# Patient Record
Sex: Male | Born: 1955 | Race: White | Hispanic: No | Marital: Married | Smoking: Former smoker
Health system: Southern US, Community
[De-identification: ages and names within clinical notes are randomized; demographics above are authoritative.]

## PROBLEM LIST (undated history)

## (undated) DIAGNOSIS — E669 Obesity, unspecified: Secondary | ICD-10-CM

## (undated) DIAGNOSIS — E785 Hyperlipidemia, unspecified: Secondary | ICD-10-CM

## (undated) DIAGNOSIS — N529 Male erectile dysfunction, unspecified: Secondary | ICD-10-CM

## (undated) DIAGNOSIS — K439 Ventral hernia without obstruction or gangrene: Secondary | ICD-10-CM

## (undated) DIAGNOSIS — F439 Reaction to severe stress, unspecified: Secondary | ICD-10-CM

## (undated) DIAGNOSIS — F419 Anxiety disorder, unspecified: Secondary | ICD-10-CM

## (undated) DIAGNOSIS — F32A Depression, unspecified: Secondary | ICD-10-CM

## (undated) HISTORY — PX: FRACTURE SURGERY: SHX138

## (undated) HISTORY — DX: Obesity, unspecified: E66.9

## (undated) HISTORY — PX: HERNIA REPAIR: SHX51

## (undated) HISTORY — DX: Hyperlipidemia, unspecified: E78.5

## (undated) HISTORY — DX: Reaction to severe stress, unspecified: F43.9

## (undated) HISTORY — DX: Male erectile dysfunction, unspecified: N52.9

## (undated) HISTORY — DX: Depression, unspecified: F32.A

---

## 2013-07-28 ENCOUNTER — Telehealth: Payer: Self-pay

## 2013-07-28 ENCOUNTER — Other Ambulatory Visit: Payer: Self-pay

## 2013-07-28 DIAGNOSIS — Z1211 Encounter for screening for malignant neoplasm of colon: Secondary | ICD-10-CM

## 2013-07-29 NOTE — Telephone Encounter (Signed)
Gastroenterology Pre-Procedure Review  Request Date:07/28/2013 Requesting Physician: Dr. Juanetta GoslingHawkins  PATIENT REVIEW QUESTIONS: The patient responded to the following health history questions as indicated:    1. Diabetes Melitis: no 2. Joint replacements in the past 12 months: no 3. Major health problems in the past 3 months: no 4. Has an artificial valve or MVP: no 5. Has a defibrillator: no 6. Has been advised in past to take antibiotics in advance of a procedure like teeth cleaning: no    MEDICATIONS & ALLERGIES:    Patient reports the following regarding taking any blood thinners:   Plavix? no Aspirin? YES Coumadin? no  Patient confirms/reports the following medications:  Current Outpatient Prescriptions  Medication Sig Dispense Refill  . aspirin 81 MG tablet Take 81 mg by mouth daily.      . cholecalciferol (VITAMIN D) 1000 UNITS tablet Take 1,000 Units by mouth 2 (two) times daily.      Marland Kitchen. glucosamine-chondroitin 500-400 MG tablet Take 1 tablet by mouth daily.      . NON FORMULARY Calcium 600 mg   One tablet daily      . Omega-3 Fatty Acids (FISH OIL) 1000 MG CAPS Take by mouth. PT takes 2 tablets daily       No current facility-administered medications for this visit.    Patient confirms/reports the following allergies:  Not on File  No orders of the defined types were placed in this encounter.    AUTHORIZATION INFORMATION Primary Insurance:   ID #:   Group #:  Pre-Cert / Auth required:  Pre-Cert / Auth #:   Secondary Insurance:   ID #:   Group #:  Pre-Cert / Auth required:  Pre-Cert / Auth #:   SCHEDULE INFORMATION: Procedure has been scheduled as follows:  Date:   09/03/2013         Time:  7:30 AM Location: [facility] Short Stay  This Gastroenterology Pre-Precedure Review Form is being routed to the following provider(s): R. Roetta SessionsMichael Rourk, MD

## 2013-07-29 NOTE — Telephone Encounter (Signed)
Ok to schedule.

## 2013-07-30 MED ORDER — PEG-KCL-NACL-NASULF-NA ASC-C 100 G PO SOLR: 1.0000 | ORAL | Status: DC

## 2013-07-30 NOTE — Telephone Encounter (Signed)
Rx sent to the pharmacy and instructions mailed to pt.  

## 2013-08-20 ENCOUNTER — Encounter (HOSPITAL_COMMUNITY): Payer: Self-pay | Admitting: Pharmacy Technician

## 2013-09-03 ENCOUNTER — Encounter (HOSPITAL_COMMUNITY): Payer: Self-pay | Admitting: *Deleted

## 2013-09-03 ENCOUNTER — Encounter (HOSPITAL_COMMUNITY)
Admission: RE | Disposition: A | Discharge status: Home / Self Care | Payer: Self-pay | Source: Ambulatory Visit | Attending: Internal Medicine

## 2013-09-03 ENCOUNTER — Ambulatory Visit (HOSPITAL_COMMUNITY)
Admission: RE | Admit: 2013-09-03 | Discharge: 2013-09-03 | Disposition: A | Discharge status: Home / Self Care | Payer: Managed Care, Other (non HMO) | Source: Ambulatory Visit | Attending: Internal Medicine | Admitting: Internal Medicine

## 2013-09-03 DIAGNOSIS — Z1211 Encounter for screening for malignant neoplasm of colon: Secondary | ICD-10-CM

## 2013-09-03 DIAGNOSIS — K5731 Diverticulosis of large intestine without perforation or abscess with bleeding: Secondary | ICD-10-CM

## 2013-09-03 DIAGNOSIS — Z91199 Patient's noncompliance with other medical treatment and regimen due to unspecified reason: Secondary | ICD-10-CM | POA: Insufficient documentation

## 2013-09-03 DIAGNOSIS — Z9119 Patient's noncompliance with other medical treatment and regimen: Secondary | ICD-10-CM | POA: Insufficient documentation

## 2013-09-03 DIAGNOSIS — K5730 Diverticulosis of large intestine without perforation or abscess without bleeding: Secondary | ICD-10-CM | POA: Insufficient documentation

## 2013-09-03 DIAGNOSIS — Z7982 Long term (current) use of aspirin: Secondary | ICD-10-CM | POA: Insufficient documentation

## 2013-09-03 HISTORY — DX: Ventral hernia without obstruction or gangrene: K43.9

## 2013-09-03 HISTORY — PX: COLONOSCOPY: SHX5424

## 2013-09-03 LAB — HM COLONOSCOPY

## 2013-09-03 SURGERY — COLONOSCOPY
Anesthesia: Moderate Sedation

## 2013-09-03 MED ORDER — MIDAZOLAM HCL 5 MG/5ML IJ SOLN
INTRAMUSCULAR | Status: DC | PRN
  Administered 2013-09-03: 2 mg via INTRAVENOUS
  Administered 2013-09-03: 1 mg via INTRAVENOUS
  Administered 2013-09-03: 2 mg via INTRAVENOUS
  Administered 2013-09-03: 1 mg via INTRAVENOUS

## 2013-09-03 MED ORDER — ONDANSETRON HCL 4 MG/2ML IJ SOLN
INTRAMUSCULAR | Status: DC
Start: 2013-09-03 — End: 2013-09-03
  Filled 2013-09-03: qty 2

## 2013-09-03 MED ORDER — MIDAZOLAM HCL 5 MG/5ML IJ SOLN
INTRAMUSCULAR | Status: DC
Start: 2013-09-03 — End: 2013-09-03
  Filled 2013-09-03: qty 10

## 2013-09-03 MED ORDER — MEPERIDINE HCL 100 MG/ML IJ SOLN
INTRAMUSCULAR | Status: DC | PRN
  Administered 2013-09-03 (×2): 50 mg via INTRAVENOUS

## 2013-09-03 MED ORDER — STERILE WATER FOR IRRIGATION IR SOLN
Status: DC | PRN
  Administered 2013-09-03: 08:00:00

## 2013-09-03 MED ORDER — MEPERIDINE HCL 100 MG/ML IJ SOLN
INTRAMUSCULAR | Status: AC
  Filled 2013-09-03: qty 2

## 2013-09-03 MED ORDER — ONDANSETRON HCL 4 MG/2ML IJ SOLN
INTRAMUSCULAR | Status: DC | PRN
  Administered 2013-09-03: 4 mg via INTRAVENOUS

## 2013-09-03 MED ORDER — SODIUM CHLORIDE 0.9 % IV SOLN
INTRAVENOUS | Status: DC
  Administered 2013-09-03: 07:00:00 via INTRAVENOUS

## 2013-09-03 NOTE — H&P (Signed)
_0 @   Primary Care Physician:  Alonza Bogus, MD Primary Gastroenterologist:  Dr.   Pre-Procedure History & Physical: HPI:  Mark Conway is a 58 y.o. male is here for a screening colonoscopy.   Past Medical History  Diagnosis Date  . Hernia of abdominal wall     Past Surgical History  Procedure Laterality Date  . Fracture surgery Right     right arm,   58 years   old    Prior to Admission medications   Medication Sig Start Date End Date Taking? Authorizing Provider  aspirin 81 MG tablet Take 81 mg by mouth daily.   Yes Historical Provider, MD  calcium carbonate (OS-CAL) 600 MG TABS tablet Take 600 mg by mouth daily with breakfast.   Yes Historical Provider, MD  cholecalciferol (VITAMIN D) 1000 UNITS tablet Take 1,000 Units by mouth 2 (two) times daily.   Yes Historical Provider, MD  glucosamine-chondroitin 500-400 MG tablet Take 2 tablets by mouth daily.    Yes Historical Provider, MD  Omega-3 Fatty Acids (FISH OIL) 1000 MG CAPS Take 1,000 mg by mouth daily. PT takes 2 tablets daily   Yes Historical Provider, MD  peg 3350 powder (MOVIPREP) 100 G SOLR Take 1 kit (200 g total) by mouth as directed. 07/30/13  Yes Daneil Dolin, MD    Allergies as of 07/28/2013  . (Not on File)    History reviewed. No pertinent family history.  History   Social History  . Marital Status: Married    Spouse Name: N/A    Number of Children: N/A  . Years of Education: N/A   Occupational History  . Not on file.   Social History Main Topics  . Smoking status: Former Smoker    Quit date: 08/04/2013  . Smokeless tobacco: Not on file  . Alcohol Use: No     Comment: rarely  . Drug Use: No  . Sexual Activity: Yes   Other Topics Concern  . Not on file   Social History Narrative  . No narrative on file    Review of Systems: See HPI, otherwise negative ROS  Physical Exam: Ht 6' (1.829 m)  Wt 276 lb (125.193 kg)  BMI 37.42 kg/m2 General:   Alert,  Well-developed,  well-nourished, pleasant and cooperative in NAD  bearded Head:  Normocephalic and atraumatic. Eyes:  Sclera clear, no icterus.   Conjunctiva pink. Ears:  Normal auditory acuity. Nose:  No deformity, discharge,  or lesions. Mouth:  No deformity or lesions, dentition normal. Neck:  Supple; no masses or thyromegaly. Lungs:  Clear throughout to auscultation.   No wheezes, crackles, or rhonchi. No acute distress. Heart:  Regular rate and rhythm; no murmurs, clicks, rubs,  or gallops. Abdomen:  Obese. Soft, nontender and nondistended. Patient has a golf ball sized umbilical hernia. Easily reducible.  No masses, hepatosplenomegaly Normal bowel sounds, without guarding, and without rebound.   Msk:  Symmetrical without gross deformities. Normal posture. Pulses:  Normal pulses noted. Extremities:  Without clubbing or edema. Neurologic:  Alert and  oriented x4;  grossly normal neurologically. Skin:  Intact without significant lesions or rashes. Cervical Nodes:  No significant cervical adenopathy. Psych:  Alert and cooperative. Normal mood and affect.  Impression/Plan: Mark Conway is now here to undergo a screening colonoscopy.   First-ever average risk screening examination Risks, benefits, limitations, imponderables and alternatives regarding colonoscopy have been reviewed with the patient. Questions have been answered. All parties agreeable.  Notice:  This dictation was prepared with Dragon dictation along with smaller phrase technology. Any transcriptional errors that result from this process are unintentional and may not be corrected upon review.

## 2013-09-03 NOTE — Op Note (Signed)
[facility] [address] [place], [id number]   COLONOSCOPY PROCEDURE REPORT  PATIENT: Mark Conway, Mark Conway  MR#:         [id number] BIRTHDATE: 1955-07-12 , 58  yrs. old GENDER: Male ENDOSCOPIST: R.  Roetta SessionsMichael Lysbeth Dicola, MD FACP FACG REFERRED BY:  Kari BaarsEdward Hawkins, M.D. PROCEDURE DATE:  09/03/2013 PROCEDURE:     Screening colonoscopy  INDICATIONS: First-ever average risk colorectal cancer screening examination; patient noncompliant with preparation; concerned solid food yesterday.  INFORMED CONSENT:  The risks, benefits, alternatives and imponderables including but not limited to bleeding, perforation as well as the possibility of a missed lesion have been reviewed.  The potential for biopsy, lesion removal, etc. have also been discussed.  Questions have been answered.  All parties agreeable. Please see the history and physical in the medical record for more information.  MEDICATIONS: Versed 6 mg IV and Demerol 100 mg in divided doses. Zofran 4 mg IV.  DESCRIPTION OF PROCEDURE:  After a digital rectal exam was performed, the EC-3890Li ([id number]  colonoscope was advanced from the anus through the rectum and colon to the area of the cecum, ileocecal valve and appendiceal orifice.  The cecum was deeply intubated.  These structures were well-seen and photographed for the record.  From the level of the cecum and ileocecal valve, the scope was slowly and cautiously withdrawn.  The mucosal surfaces were carefully surveyed utilizing scope tip deflection to facilitate fold flattening as needed.  The scope was pulled down into the rectum where a thorough examination including retroflexion was performed.    FINDINGS:  Preparation became eventually adequate; initially inadequate. Washing and suctioning removed the colonic effluent for eventually to gain adequate visualization. Somewhat of a redundant colon. Scattered left-sided transverse diverticula; the remainder of the colonic  mucosa appeared normal.  THERAPEUTIC / DIAGNOSTIC MANEUVERS PERFORMED:  None  COMPLICATIONS: none  CECAL WITHDRAWAL TIME:  12 minutes  IMPRESSION:  Colonic diverticulosis  RECOMMENDATIONS: Repeat colonoscopy in 10 years for screening purposes   _______________________________ eSigned:  R. Roetta SessionsMichael Emannuel Vise, MD FACP [facility] 09/03/2013 8:26 AM   CC:    PATIENT NAME:  Mark Conway, Mark Conway MR#: [id number]

## 2013-09-03 NOTE — Discharge Instructions (Signed)
°  Colonoscopy Discharge Instructions  Read the instructions outlined below and refer to this sheet in the next few weeks. These discharge instructions provide you with general information on caring for yourself after you leave the hospital. Your doctor may also give you specific instructions. While your treatment has been planned according to the most current medical practices available, unavoidable complications occasionally occur. If you have any problems or questions after discharge, call Dr. Jena Gaussourk at [phone]. ACTIVITY  You may resume your regular activity, but move at a slower pace for the next 24 hours.   Take frequent rest periods for the next 24 hours.   Walking will help get rid of the air and reduce the bloated feeling in your belly (abdomen).   No driving for 24 hours (because of the medicine (anesthesia) used during the test).    Do not sign any important legal documents or operate any machinery for 24 hours (because of the anesthesia used during the test).  NUTRITION  Drink plenty of fluids.   You may resume your normal diet as instructed by your doctor.   Begin with a light meal and progress to your normal diet. Heavy or fried foods are harder to digest and may make you feel sick to your stomach (nauseated).   Avoid alcoholic beverages for 24 hours or as instructed.  MEDICATIONS  You may resume your normal medications unless your doctor tells you otherwise.  WHAT YOU CAN EXPECT TODAY  Some feelings of bloating in the abdomen.   Passage of more gas than usual.   Spotting of blood in your stool or on the toilet paper.  IF YOU HAD POLYPS REMOVED DURING THE COLONOSCOPY:  No aspirin products for 7 days or as instructed.   No alcohol for 7 days or as instructed.   Eat a soft diet for the next 24 hours.  FINDING OUT THE RESULTS OF YOUR TEST Not all test results are available during your visit. If your test results are not back during the visit, make an appointment  with your caregiver to find out the results. Do not assume everything is normal if you have not heard from your caregiver or the medical facility. It is important for you to follow up on all of your test results.  SEEK IMMEDIATE MEDICAL ATTENTION IF:  You have more than a spotting of blood in your stool.   Your belly is swollen (abdominal distention).   You are nauseated or vomiting.   You have a temperature over 101.   You have abdominal pain or discomfort that is severe or gets worse throughout the day.   Diverticulosis information provided  Recommend repeat colonoscopy in 10 years for screening purposes

## 2013-09-06 ENCOUNTER — Encounter (HOSPITAL_COMMUNITY): Payer: Self-pay | Admitting: Internal Medicine

## 2013-10-19 ENCOUNTER — Other Ambulatory Visit (HOSPITAL_COMMUNITY): Payer: Self-pay | Admitting: Pulmonary Disease

## 2013-10-19 DIAGNOSIS — R109 Unspecified abdominal pain: Secondary | ICD-10-CM

## 2013-10-25 ENCOUNTER — Ambulatory Visit (HOSPITAL_COMMUNITY)
Admission: RE | Admit: 2013-10-25 | Discharge: 2013-10-25 | Disposition: A | Discharge status: Home / Self Care | Payer: Managed Care, Other (non HMO) | Source: Ambulatory Visit | Attending: Pulmonary Disease | Admitting: Pulmonary Disease

## 2013-10-25 ENCOUNTER — Ambulatory Visit (HOSPITAL_COMMUNITY): Payer: Managed Care, Other (non HMO)

## 2013-10-25 DIAGNOSIS — R109 Unspecified abdominal pain: Secondary | ICD-10-CM | POA: Insufficient documentation

## 2016-02-15 LAB — LIPID PANEL
Cholesterol: 192 (ref 0–200)
HDL: 30 — AB (ref 35–70)
LDL Cholesterol: 133
Triglycerides: 147 (ref 40–160)

## 2016-02-15 LAB — CBC AND DIFFERENTIAL: WBC: 5

## 2016-02-15 LAB — PSA: PSA: 0.6

## 2016-02-15 LAB — TSH: TSH: 1.67 (ref <=5.90)

## 2018-02-04 ENCOUNTER — Other Ambulatory Visit (HOSPITAL_COMMUNITY): Payer: Self-pay | Admitting: Pulmonary Disease

## 2018-02-04 ENCOUNTER — Ambulatory Visit (HOSPITAL_COMMUNITY)
Admission: RE | Admit: 2018-02-04 | Discharge: 2018-02-04 | Disposition: A | Discharge status: Home / Self Care | Payer: BC Managed Care – PPO | Source: Ambulatory Visit | Attending: Pulmonary Disease | Admitting: Pulmonary Disease

## 2018-02-04 DIAGNOSIS — M25511 Pain in right shoulder: Secondary | ICD-10-CM | POA: Insufficient documentation

## 2018-02-28 ENCOUNTER — Other Ambulatory Visit: Payer: Self-pay

## 2018-02-28 ENCOUNTER — Emergency Department (HOSPITAL_COMMUNITY): Payer: BC Managed Care – PPO

## 2018-02-28 ENCOUNTER — Observation Stay (HOSPITAL_COMMUNITY)
Admission: EM | Admit: 2018-02-28 | Discharge: 2018-03-01 | Disposition: A | Discharge status: Home / Self Care | Payer: BC Managed Care – PPO | Attending: General Surgery | Admitting: General Surgery

## 2018-02-28 ENCOUNTER — Encounter (HOSPITAL_COMMUNITY): Payer: Self-pay | Admitting: Emergency Medicine

## 2018-02-28 DIAGNOSIS — Z87891 Personal history of nicotine dependence: Secondary | ICD-10-CM | POA: Insufficient documentation

## 2018-02-28 DIAGNOSIS — K439 Ventral hernia without obstruction or gangrene: Secondary | ICD-10-CM

## 2018-02-28 DIAGNOSIS — Z79899 Other long term (current) drug therapy: Secondary | ICD-10-CM | POA: Insufficient documentation

## 2018-02-28 DIAGNOSIS — F419 Anxiety disorder, unspecified: Secondary | ICD-10-CM | POA: Diagnosis not present

## 2018-02-28 DIAGNOSIS — K429 Umbilical hernia without obstruction or gangrene: Secondary | ICD-10-CM

## 2018-02-28 DIAGNOSIS — K420 Umbilical hernia with obstruction, without gangrene: Secondary | ICD-10-CM | POA: Diagnosis not present

## 2018-02-28 DIAGNOSIS — Z01818 Encounter for other preprocedural examination: Secondary | ICD-10-CM

## 2018-02-28 DIAGNOSIS — Z7982 Long term (current) use of aspirin: Secondary | ICD-10-CM | POA: Insufficient documentation

## 2018-02-28 HISTORY — DX: Anxiety disorder, unspecified: F41.9

## 2018-02-28 LAB — URINALYSIS, ROUTINE W REFLEX MICROSCOPIC
Bilirubin Urine: NEGATIVE
Glucose, UA: NEGATIVE mg/dL
HGB URINE DIPSTICK: NEGATIVE
Ketones, ur: 5 mg/dL — AB
Leukocytes, UA: NEGATIVE
Nitrite: NEGATIVE
PROTEIN: 30 mg/dL — AB
Specific Gravity, Urine: 1.031 — ABNORMAL HIGH (ref 1.005–1.030)
pH: 5 (ref 5.0–8.0)

## 2018-02-28 LAB — CBC
HEMATOCRIT: 50.1 % (ref 39.0–52.0)
HEMOGLOBIN: 16.2 g/dL (ref 13.0–17.0)
MCH: 26.9 pg (ref 26.0–34.0)
MCHC: 32.3 g/dL (ref 30.0–36.0)
MCV: 83.2 fL (ref 80.0–100.0)
Platelets: 263 10*3/uL (ref 150–400)
RBC: 6.02 MIL/uL — ABNORMAL HIGH (ref 4.22–5.81)
RDW: 13.5 % (ref 11.5–15.5)
WBC: 12.8 10*3/uL — ABNORMAL HIGH (ref 4.0–10.5)
nRBC: 0 % (ref 0.0–0.2)

## 2018-02-28 LAB — COMPREHENSIVE METABOLIC PANEL
ALBUMIN: 4.9 g/dL (ref 3.5–5.0)
ALT: 17 U/L (ref 0–44)
AST: 20 U/L (ref 15–41)
Alkaline Phosphatase: 48 U/L (ref 38–126)
Anion gap: 12 (ref 5–15)
BUN: 35 mg/dL — AB (ref 6–20)
CHLORIDE: 100 mmol/L (ref 98–111)
CO2: 27 mmol/L (ref 22–32)
Calcium: 10.3 mg/dL (ref 8.9–10.3)
Creatinine, Ser: 1.25 mg/dL — ABNORMAL HIGH (ref 0.61–1.24)
GFR calc Af Amer: >60 mL/min (ref >60)
GFR calc non Af Amer: >60 mL/min (ref >60)
GLUCOSE: 116 mg/dL — AB (ref 70–99)
Potassium: 3.7 mmol/L (ref 3.5–5.1)
Sodium: 139 mmol/L (ref 135–145)
Total Bilirubin: 1.8 mg/dL — ABNORMAL HIGH (ref 0.3–1.2)
Total Protein: 8 g/dL (ref 6.5–8.1)

## 2018-02-28 LAB — LIPASE, BLOOD: LIPASE: 32 U/L (ref 11–51)

## 2018-02-28 MED ORDER — IOPAMIDOL (ISOVUE-300) INJECTION 61%
100.0000 mL | Freq: Once | INTRAVENOUS | Status: AC | PRN
  Administered 2018-02-28: 100 mL via INTRAVENOUS

## 2018-02-28 MED ORDER — MORPHINE SULFATE (PF) 4 MG/ML IV SOLN
4.0000 mg | Freq: Once | INTRAVENOUS | Status: AC
  Administered 2018-02-28: 4 mg via INTRAVENOUS
  Filled 2018-02-28: qty 1

## 2018-02-28 MED ORDER — SERTRALINE HCL 50 MG PO TABS
50.0000 mg | ORAL_TABLET | Freq: Every day | ORAL | Status: DC
  Administered 2018-02-28: 50 mg via ORAL
  Filled 2018-02-28 (×2): qty 1

## 2018-02-28 MED ORDER — DOCUSATE SODIUM 100 MG PO CAPS
100.0000 mg | ORAL_CAPSULE | Freq: Two times a day (BID) | ORAL | Status: DC
  Administered 2018-02-28: 100 mg via ORAL
  Filled 2018-02-28 (×2): qty 1

## 2018-02-28 MED ORDER — ENOXAPARIN SODIUM 40 MG/0.4ML ~~LOC~~ SOLN
40.0000 mg | SUBCUTANEOUS | Status: DC
  Administered 2018-02-28: 40 mg via SUBCUTANEOUS
  Filled 2018-02-28: qty 0.4

## 2018-02-28 MED ORDER — ENSURE ENLIVE PO LIQD: 237.0000 mL | Freq: Two times a day (BID) | ORAL | Status: DC

## 2018-02-28 MED ORDER — ONDANSETRON HCL 4 MG/2ML IJ SOLN: 4.0000 mg | Freq: Four times a day (QID) | INTRAMUSCULAR | Status: DC | PRN

## 2018-02-28 MED ORDER — METOPROLOL TARTRATE 5 MG/5ML IV SOLN: 5.0000 mg | Freq: Four times a day (QID) | INTRAVENOUS | Status: DC | PRN

## 2018-02-28 MED ORDER — IBUPROFEN 600 MG PO TABS: 600.0000 mg | ORAL_TABLET | Freq: Four times a day (QID) | ORAL | Status: DC | PRN

## 2018-02-28 MED ORDER — LACTATED RINGERS IV SOLN
INTRAVENOUS | Status: DC
  Administered 2018-02-28: 19:00:00 via INTRAVENOUS

## 2018-02-28 MED ORDER — MORPHINE SULFATE (PF) 2 MG/ML IV SOLN: 2.0000 mg | INTRAVENOUS | Status: DC | PRN

## 2018-02-28 MED ORDER — HYDROCODONE-ACETAMINOPHEN 5-325 MG PO TABS: 1.0000 | ORAL_TABLET | ORAL | Status: DC | PRN

## 2018-02-28 MED ORDER — ZOLPIDEM TARTRATE 5 MG PO TABS: 5.0000 mg | ORAL_TABLET | Freq: Every evening | ORAL | Status: DC | PRN

## 2018-02-28 MED ORDER — DIPHENHYDRAMINE HCL 12.5 MG/5ML PO ELIX: 12.5000 mg | ORAL_SOLUTION | Freq: Four times a day (QID) | ORAL | Status: DC | PRN

## 2018-02-28 MED ORDER — SIMETHICONE 80 MG PO CHEW: 40.0000 mg | CHEWABLE_TABLET | Freq: Four times a day (QID) | ORAL | Status: DC | PRN

## 2018-02-28 MED ORDER — DIPHENHYDRAMINE HCL 50 MG/ML IJ SOLN: 12.5000 mg | Freq: Four times a day (QID) | INTRAMUSCULAR | Status: DC | PRN

## 2018-02-28 MED ORDER — CEFAZOLIN SODIUM-DEXTROSE 2-4 GM/100ML-% IV SOLN
2.0000 g | INTRAVENOUS | Status: AC
  Administered 2018-03-01: 2 g via INTRAVENOUS
  Filled 2018-02-28 (×2): qty 100

## 2018-02-28 MED ORDER — MUPIROCIN 2 % EX OINT: 1.0000 "application " | TOPICAL_OINTMENT | Freq: Two times a day (BID) | CUTANEOUS | Status: DC

## 2018-02-28 MED ORDER — ONDANSETRON 4 MG PO TBDP: 4.0000 mg | ORAL_TABLET | Freq: Four times a day (QID) | ORAL | Status: DC | PRN

## 2018-02-28 NOTE — ED Triage Notes (Signed)
Pt c/o abdominal pain from umbilical hernia x 3-4 weeks. Pt has an appt with Dr. Lovell Sheehan 11/21. Endorses n/v and constipation.

## 2018-02-28 NOTE — ED Provider Notes (Signed)
[facility] EMERGENCY DEPARTMENT Provider Note   CSN: [id number] Arrival date & time: 02/28/18  1218     History   Chief Complaint Chief Complaint  Patient presents with  . Hernia    HPI Mark Conway is a 62 y.o. male.  Patient has had a periumbilical hernia for approximately 10 years.  He has had a surgical opinion, but no surgery at this point.  The hernia has bothered him more frequently in the past week, worse the past 2-3 days.  He is able to eat without vomiting.  No fever, sweats, chills.  Severity of pain is moderate.  Palpation makes pain worse.  He has been unable to reduce his hernia.     Past Medical History:  Diagnosis Date  . Anxiety   . Hernia of abdominal wall     Patient Active Problem List   Diagnosis Date Noted  . Periumbilical hernia 02/28/2018  . Hernia of abdominal wall   . Anxiety   . Incarcerated umbilical hernia     Past Surgical History:  Procedure Laterality Date  . COLONOSCOPY N/A 09/03/2013   Procedure: COLONOSCOPY;  Surgeon: Corbin Ade, MD;  Location: [facility];  Service: Endoscopy;  Laterality: N/A;  7:30 AM  . FRACTURE SURGERY Right    right arm,   62 years   old        Home Medications    Prior to Admission medications   Medication Sig Start Date End Date Taking? Authorizing Provider  aspirin 81 MG tablet Take 81 mg by mouth daily.   Yes [provider]  calcium carbonate (OS-CAL) 600 MG TABS tablet Take 600 mg by mouth daily with breakfast.   Yes [provider]  cholecalciferol (VITAMIN D) 1000 UNITS tablet Take 1,000 Units by mouth 2 (two) times daily.   Yes [provider]  glucosamine-chondroitin 500-400 MG tablet Take 2 tablets by mouth daily.    Yes [provider]  Omega-3 Fatty Acids (FISH OIL) 1000 MG CAPS Take 2 capsules by mouth daily. PT takes 2 tablets daily   Yes [provider]  sertraline (ZOLOFT) 50 MG tablet Take 50 mg by mouth daily.   Yes [provider]  TURMERIC PO Take 1 tablet by mouth daily.   Yes [provider]    Family History Family History  Problem Relation Age of Onset  . Dementia Mother   . Heart attack Father     Social History Social History   Tobacco Use  . Smoking status: Former Smoker    Last attempt to quit: 08/04/2013    Years since quitting: 4.5  . Smokeless tobacco: Never Used  Substance Use Topics  . Alcohol use: No    Comment: rarely  . Drug use: No     Allergies   Patient has no known allergies.   Review of Systems Review of Systems  All other systems reviewed and are negative.    Physical Exam Updated Vital Signs BP 134/87   Pulse 82   Temp 97.8 F (36.6 C) (Oral)   Resp 17   Ht 6\' 1"  (1.854 m)   Wt 117.9 kg   SpO2 97%   BMI 34.30 kg/m   Physical Exam  Constitutional: He is oriented to person, place, and time. He appears well-developed and well-nourished.  HENT:  Head: Normocephalic and atraumatic.  Eyes: Conjunctivae are normal.  Neck: Neck supple.  Cardiovascular: Normal rate and regular rhythm.  Pulmonary/Chest: Effort normal and  breath sounds normal.  Abdominal:  Obvious periumbilical hernia approximately 4 cm in diameter.  Soft tissues erythematous and tender to palpation.  Unable to successfully reduce hernia  Musculoskeletal: Normal range of motion.  Neurological: He is alert and oriented to person, place, and time.  Skin: Skin is warm and dry.  Psychiatric: He has a normal mood and affect. His behavior is normal.  Nursing note and vitals reviewed.    ED Treatments / Results  Labs (all labs ordered are listed, but only abnormal results are displayed) Labs Reviewed  COMPREHENSIVE METABOLIC PANEL - Abnormal; Notable for the following components:      Result Value   Glucose, Bld 116 (*)    BUN 35 (*)    Creatinine, Ser 1.25 (*)    Total Bilirubin 1.8 (*)    All other components within normal limits  CBC - Abnormal; Notable for the  following components:   WBC 12.8 (*)    RBC 6.02 (*)    All other components within normal limits  URINALYSIS, ROUTINE W REFLEX MICROSCOPIC - Abnormal; Notable for the following components:   Color, Urine AMBER (*)    APPearance CLOUDY (*)    Specific Gravity, Urine 1.031 (*)    Ketones, ur 5 (*)    Protein, ur 30 (*)    Bacteria, UA RARE (*)    All other components within normal limits  LIPASE, BLOOD    EKG None  Radiology Ct Abdomen Pelvis W Contrast  Result Date: 02/28/2018 CLINICAL DATA:  Abdominal pain.  Ventral hernia. EXAM: CT ABDOMEN AND PELVIS WITH CONTRAST TECHNIQUE: Multidetector CT imaging of the abdomen and pelvis was performed using the standard protocol following bolus administration of intravenous contrast. CONTRAST:  ISOVUE-300 IOPAMIDOL (ISOVUE-300) INJECTION 61% COMPARISON:  None. FINDINGS: Lower chest: 6 mm subpleural nodule is seen in right middle lobe. Visualized lung bases are otherwise unremarkable. Hepatobiliary: Small gallstone is noted without inflammation. No biliary dilatation is noted. Liver is unremarkable. Pancreas: Unremarkable. No pancreatic ductal dilatation or surrounding inflammatory changes. Spleen: Normal in size without focal abnormality. Adrenals/Urinary Tract: Adrenal glands are unremarkable. Kidneys are normal, without renal calculi, focal lesion, or hydronephrosis. Bladder is unremarkable. Stomach/Bowel: Large periumbilical hernia is noted which contains a portion of small bowel, which appears to be incarcerated and resulting in proximal small bowel obstruction. Stool is noted in the colon. No colonic dilatation is noted. The appendix appears normal. The stomach is unremarkable. Diverticulosis of descending and sigmoid colon is noted without inflammation. Vascular/Lymphatic: Aortic atherosclerosis. No enlarged abdominal or pelvic lymph nodes. Reproductive: Prostate is unremarkable. Other: No abnormal fluid collection is noted. Musculoskeletal: No  acute or significant osseous findings. IMPRESSION: Large periumbilical hernia is noted which contains a portion of incarcerated small bowel, which results in proximal small bowel obstruction. Diverticulosis of descending and sigmoid colon is noted without inflammation. Aortic Atherosclerosis (ICD10-I70.0). Electronically Signed   By: Lupita Raider, M.D.   On: 02/28/2018 17:44    Procedures Procedures (including critical care time)  Medications Ordered in ED Medications  lactated ringers infusion ( Intravenous New Bag/Given 02/28/18 1830)  iopamidol (ISOVUE-300) 61 % injection 100 mL (100 mLs Intravenous Contrast Given 02/28/18 1703)  morphine 4 MG/ML injection 4 mg (4 mg Intravenous Given 02/28/18 1744)     Initial Impression / Assessment and Plan / ED Course  I have reviewed the triage vital signs and the nursing notes.  Pertinent labs & imaging results that were available during my care of the  patient were reviewed by me and considered in my medical decision making (see chart for details).     Patient presents with worsening pain with a known periumbilical hernia.  CT abdomen pelvis reveals a portion of incarcerated small bowel.  Will consult general surgery for possible surgical intervention.  CRITICAL CARE Performed by: Donnetta Hutching Total critical care time: 30 minutes Critical care time was exclusive of separately billable procedures and treating other patients. Critical care was necessary to treat or prevent imminent or life-threatening deterioration. Critical care was time spent personally by me on the following activities: development of treatment plan with patient and/or surrogate as well as nursing, discussions with consultants, evaluation of patient's response to treatment, examination of patient, obtaining history from patient or surrogate, ordering and performing treatments and interventions, ordering and review of laboratory studies, ordering and review of radiographic studies,  pulse oximetry and re-evaluation of patient's condition.  Final Clinical Impressions(s) / ED Diagnoses   Final diagnoses:  Periumbilical hernia    ED Discharge Orders    None       Donnetta Hutching, MD 02/28/18 1927

## 2018-02-28 NOTE — H&P (Signed)
[facility] History and Physical  Reason for Referral: Umbilical hernia  Referring Physician:  Dr. Lacinda Axon   Chief Complaint    Hernia      Mark Conway is a 62 y.o. male.  HPI: Mark Conway is a 62 yo male with known umbilical hernia that has been present for years and has been getting worse over the last few weeks. He says that he has been getting sick with nausea/vomiting about once a month and that this occurred on Wednesday and since then he has not felt well.  He says that he vomited a few times. He has an appointment with Dr. Arnoldo Morale to discuss repair 11/21. His last BM was Wednesday but his normal is about every 2-3 days. He denies any fevers or chills.  He has not had an appetite either in the last few days with this nausea/vomiting.   He is otherwise relatively healthy per his report and takes minimal medications.  Past Medical History:  Diagnosis Date  . Anxiety   . Hernia of abdominal wall     Past Surgical History:  Procedure Laterality Date  . COLONOSCOPY N/A 09/03/2013   Procedure: COLONOSCOPY;  Surgeon: Daneil Dolin, MD;  Location: [facility];  Service: Endoscopy;  Laterality: N/A;  7:30 AM  . FRACTURE SURGERY Right    right arm,   62 years   old    Family History  Problem Relation Age of Onset  . Dementia Mother   . Heart attack Father     Social History   Tobacco Use  . Smoking status: Former Smoker    Last attempt to quit: 08/04/2013    Years since quitting: 4.5  . Smokeless tobacco: Never Used  Substance Use Topics  . Alcohol use: No    Comment: rarely  . Drug use: No    Medications: I have reviewed the patient's current medications. No current facility-administered medications for this encounter.    Current Outpatient Medications  Medication Sig Dispense Refill Last Dose  . aspirin 81 MG tablet Take 81 mg by mouth daily.   02/27/2018 at Unknown time  . calcium carbonate (OS-CAL) 600 MG TABS tablet Take 600 mg by mouth daily with  breakfast.   02/27/2018 at Unknown time  . cholecalciferol (VITAMIN D) 1000 UNITS tablet Take 1,000 Units by mouth 2 (two) times daily.   02/27/2018 at Unknown time  . glucosamine-chondroitin 500-400 MG tablet Take 2 tablets by mouth daily.    02/27/2018 at Unknown time  . Omega-3 Fatty Acids (FISH OIL) 1000 MG CAPS Take 2 capsules by mouth daily. PT takes 2 tablets daily   02/27/2018 at Unknown time  . sertraline (ZOLOFT) 50 MG tablet Take 50 mg by mouth daily.   02/27/2018 at Unknown time  . TURMERIC PO Take 1 tablet by mouth daily.   02/27/2018 at Unknown time    Allergies: No Known Allergies   ROS:  A comprehensive review of systems was negative except for: Gastrointestinal: positive for abdominal pain  Blood pressure 125/88, pulse 79, temperature 97.8 F (36.6 C), temperature source Oral, resp. rate 18, height 6' 1" (1.854 m), weight 117.9 kg, SpO2 97 %. Physical Exam  Constitutional: He is oriented to person, place, and time. He appears well-developed and well-nourished.  HENT:  Head: Normocephalic and atraumatic.  Eyes: Pupils are equal, round, and reactive to light. EOM are normal.  Neck: Normal range of motion.  Cardiovascular: Normal rate and regular rhythm.  Pulmonary/Chest: Effort normal.  Abdominal: Soft. He exhibits no distension. There is tenderness. No hernia.  Reduced hernia after morphine given, completely reduced, some redness of the skin after reduction from friction  Musculoskeletal: Normal range of motion. He exhibits no edema.  Neurological: He is alert and oriented to person, place, and time.  Skin: Skin is warm and dry.  Psychiatric: He has a normal mood and affect. His behavior is normal. Judgment and thought content normal.  Vitals reviewed.   Results: Results for orders placed or performed during the hospital encounter of 02/28/18 (from the past 48 hour(s))  Urinalysis, Routine w reflex microscopic     Status: Abnormal   Collection Time: 02/28/18 12:24 PM   Result Value Ref Range   Color, Urine AMBER (A) YELLOW    Comment: BIOCHEMICALS MAY BE AFFECTED BY COLOR   APPearance CLOUDY (A) CLEAR   Specific Gravity, Urine 1.031 (H) 1.005 - 1.030   pH 5.0 5.0 - 8.0   Glucose, UA NEGATIVE NEGATIVE mg/dL   Hgb urine dipstick NEGATIVE NEGATIVE   Bilirubin Urine NEGATIVE NEGATIVE   Ketones, ur 5 (A) NEGATIVE mg/dL   Protein, ur 30 (A) NEGATIVE mg/dL   Nitrite NEGATIVE NEGATIVE   Leukocytes, UA NEGATIVE NEGATIVE   RBC / HPF 0-5 0 - 5 RBC/hpf   WBC, UA 0-5 0 - 5 WBC/hpf   Bacteria, UA RARE (A) NONE SEEN   Squamous Epithelial / LPF 0-5 0 - 5   Mucus PRESENT     Comment: Performed at [facility], [address] [place], [place] [address]  Lipase, blood     Status: None   Collection Time: 02/28/18  2:02 PM  Result Value Ref Range   Lipase 32 11 - 51 U/L    Comment: Performed at [facility], [address] [place], [place] [address]  Comprehensive metabolic panel     Status: Abnormal   Collection Time: 02/28/18  2:02 PM  Result Value Ref Range   Sodium 139 135 - 145 mmol/L   Potassium 3.7 3.5 - 5.1 mmol/L   Chloride 100 98 - 111 mmol/L   CO2 27 22 - 32 mmol/L   Glucose, Bld 116 (H) 70 - 99 mg/dL   BUN 35 (H) 6 - 20 mg/dL   Creatinine, Ser 1.25 (H) 0.61 - 1.24 mg/dL   Calcium 10.3 8.9 - 10.3 mg/dL   Total Protein 8.0 6.5 - 8.1 g/dL   Albumin 4.9 3.5 - 5.0 g/dL   AST 20 15 - 41 U/L   ALT 17 0 - 44 U/L   Alkaline Phosphatase 48 38 - 126 U/L   Total Bilirubin 1.8 (H) 0.3 - 1.2 mg/dL   GFR calc non Af Amer >60 >60 mL/min   GFR calc Af Amer >60 >60 mL/min    Comment: (NOTE) The eGFR has been calculated using the CKD EPI equation. This calculation has not been validated in all clinical situations. eGFR's persistently <60 mL/min signify possible Chronic Kidney Disease.    Anion gap 12 5 - 15    Comment: Performed at [facility], [address] , [place] [address]  CBC     Status: Abnormal   Collection Time: 02/28/18  2:02 PM   Result Value Ref Range   WBC 12.8 (H) 4.0 - 10.5 K/uL   RBC 6.02 (H) 4.22 - 5.81 MIL/uL   Hemoglobin 16.2 13.0 - 17.0 g/dL   HCT 50.1 39.0 - 52.0 %   MCV 83.2 80.0 - 100.0 fL   MCH   26.9 26.0 - 34.0 pg   MCHC 32.3 30.0 - 36.0 g/dL   RDW 13.5 11.5 - 15.5 %   Platelets 263 150 - 400 K/uL   nRBC 0.0 0.0 - 0.2 %    Comment: Performed at [facility], [address] [place], [place] [address]    Ct Abdomen Pelvis W Contrast  Result Date: 02/28/2018 CLINICAL DATA:  Abdominal pain.  Ventral hernia. EXAM: CT ABDOMEN AND PELVIS WITH CONTRAST TECHNIQUE: Multidetector CT imaging of the abdomen and pelvis was performed using the standard protocol following bolus administration of intravenous contrast. CONTRAST:  100mL ISOVUE-300 IOPAMIDOL (ISOVUE-300) INJECTION 61% COMPARISON:  None. FINDINGS: Lower chest: 6 mm subpleural nodule is seen in right middle lobe. Visualized lung bases are otherwise unremarkable. Hepatobiliary: Small gallstone is noted without inflammation. No biliary dilatation is noted. Liver is unremarkable. Pancreas: Unremarkable. No pancreatic ductal dilatation or surrounding inflammatory changes. Spleen: Normal in size without focal abnormality. Adrenals/Urinary Tract: Adrenal glands are unremarkable. Kidneys are normal, without renal calculi, focal lesion, or hydronephrosis. Bladder is unremarkable. Stomach/Bowel: Large periumbilical hernia is noted which contains a portion of small bowel, which appears to be incarcerated and resulting in proximal small bowel obstruction. Stool is noted in the colon. No colonic dilatation is noted. The appendix appears normal. The stomach is unremarkable. Diverticulosis of descending and sigmoid colon is noted without inflammation. Vascular/Lymphatic: Aortic atherosclerosis. No enlarged abdominal or pelvic lymph nodes. Reproductive: Prostate is unremarkable. Other: No abnormal fluid collection is noted. Musculoskeletal: No acute or significant osseous  findings. IMPRESSION: Large periumbilical hernia is noted which contains a portion of incarcerated small bowel, which results in proximal small bowel obstruction. Diverticulosis of descending and sigmoid colon is noted without inflammation. Aortic Atherosclerosis (ICD10-I70.0). Electronically Signed   By: James  Green Jr, M.D.   On: 02/28/2018 17:44     Assessment & Plan:  Mark Conway is a 62 y.o. male with a reduced umbilical hernia that was incarcerated causing obstruction. This was reduced after morphine by me.    -Admit overnight -OR tomorrow for umbilical hernia repair with mesh  -EKG/ CXR tonight -Sips tonight, IVF   All questions were answered to the satisfaction of the patient and family.  The risk and benefits of umbilical hernia repair with mesh were discussed including but not limited to bleeding, infection, use of mesh, risk of recurrence.  After careful consideration, Mark Conway has decided to proceed.    Lindsay C Bridges 02/28/2018, 6:03 PM     

## 2018-03-01 ENCOUNTER — Observation Stay (HOSPITAL_COMMUNITY): Payer: BC Managed Care – PPO | Admitting: Anesthesiology

## 2018-03-01 ENCOUNTER — Encounter (HOSPITAL_COMMUNITY)
Admission: EM | Disposition: A | Discharge status: Home / Self Care | Payer: Self-pay | Source: Home / Self Care | Attending: Emergency Medicine

## 2018-03-01 ENCOUNTER — Encounter (HOSPITAL_COMMUNITY): Payer: Self-pay | Admitting: Anesthesiology

## 2018-03-01 DIAGNOSIS — K420 Umbilical hernia with obstruction, without gangrene: Secondary | ICD-10-CM | POA: Diagnosis not present

## 2018-03-01 HISTORY — PX: UMBILICAL HERNIA REPAIR: SHX196

## 2018-03-01 LAB — SURGICAL PCR SCREEN
MRSA, PCR: NEGATIVE
STAPHYLOCOCCUS AUREUS: POSITIVE — AB

## 2018-03-01 SURGERY — REPAIR, HERNIA, UMBILICAL, ADULT
Anesthesia: General

## 2018-03-01 MED ORDER — DEXAMETHASONE SODIUM PHOSPHATE 10 MG/ML IJ SOLN
INTRAMUSCULAR | Status: AC
  Filled 2018-03-01: qty 1

## 2018-03-01 MED ORDER — FENTANYL CITRATE (PF) 100 MCG/2ML IJ SOLN
INTRAMUSCULAR | Status: AC
  Filled 2018-03-01: qty 2

## 2018-03-01 MED ORDER — DEXAMETHASONE SODIUM PHOSPHATE 4 MG/ML IJ SOLN
INTRAMUSCULAR | Status: DC | PRN
  Administered 2018-03-01: 8 mg via INTRAVENOUS

## 2018-03-01 MED ORDER — LIDOCAINE HCL (CARDIAC) PF 100 MG/5ML IV SOSY
PREFILLED_SYRINGE | INTRAVENOUS | Status: DC | PRN
  Administered 2018-03-01: 100 mg via INTRAVENOUS

## 2018-03-01 MED ORDER — BUPIVACAINE LIPOSOME 1.3 % IJ SUSP
INTRAMUSCULAR | Status: DC | PRN
  Administered 2018-03-01: 20 mL

## 2018-03-01 MED ORDER — DOCUSATE SODIUM 100 MG PO CAPS: 100.0000 mg | ORAL_CAPSULE | Freq: Two times a day (BID) | ORAL | 2 refills | Status: DC

## 2018-03-01 MED ORDER — PROPOFOL 10 MG/ML IV BOLUS
INTRAVENOUS | Status: DC | PRN
  Administered 2018-03-01: 200 mg via INTRAVENOUS

## 2018-03-01 MED ORDER — PHENYLEPHRINE HCL 10 MG/ML IJ SOLN
INTRAMUSCULAR | Status: DC | PRN
  Administered 2018-03-01 (×4): 100 ug via INTRAVENOUS

## 2018-03-01 MED ORDER — HYDROCODONE-ACETAMINOPHEN 7.5-325 MG PO TABS
1.0000 | ORAL_TABLET | Freq: Once | ORAL | Status: AC | PRN
  Administered 2018-03-01: 1 via ORAL
  Filled 2018-03-01: qty 1

## 2018-03-01 MED ORDER — MEPERIDINE HCL 50 MG/ML IJ SOLN: 6.2500 mg | INTRAMUSCULAR | Status: DC | PRN

## 2018-03-01 MED ORDER — ONDANSETRON HCL 4 MG/2ML IJ SOLN
INTRAMUSCULAR | Status: DC | PRN
  Administered 2018-03-01: 4 mg via INTRAVENOUS

## 2018-03-01 MED ORDER — ACETAMINOPHEN 325 MG PO TABS: 650.0000 mg | ORAL_TABLET | Freq: Four times a day (QID) | ORAL | Status: DC | PRN

## 2018-03-01 MED ORDER — FENTANYL CITRATE (PF) 100 MCG/2ML IJ SOLN
INTRAMUSCULAR | Status: DC | PRN
  Administered 2018-03-01 (×2): 50 ug via INTRAVENOUS

## 2018-03-01 MED ORDER — KETOROLAC TROMETHAMINE 30 MG/ML IJ SOLN: 30.0000 mg | Freq: Once | INTRAMUSCULAR | Status: DC | PRN

## 2018-03-01 MED ORDER — HYDROMORPHONE HCL 1 MG/ML IJ SOLN
0.2500 mg | INTRAMUSCULAR | Status: DC | PRN
  Administered 2018-03-01: 0.5 mg via INTRAVENOUS
  Filled 2018-03-01: qty 0.5

## 2018-03-01 MED ORDER — OXYCODONE HCL 5 MG PO TABS: 5.0000 mg | ORAL_TABLET | ORAL | 0 refills | Status: DC | PRN

## 2018-03-01 MED ORDER — SODIUM CHLORIDE 0.9 % IR SOLN
Status: DC | PRN
  Administered 2018-03-01: 500 mL

## 2018-03-01 MED ORDER — KETOROLAC TROMETHAMINE 30 MG/ML IJ SOLN
INTRAMUSCULAR | Status: DC | PRN
  Administered 2018-03-01: 30 mg via INTRAVENOUS

## 2018-03-01 MED ORDER — KETOROLAC TROMETHAMINE 30 MG/ML IJ SOLN
INTRAMUSCULAR | Status: AC
  Filled 2018-03-01: qty 1

## 2018-03-01 MED ORDER — SUCCINYLCHOLINE CHLORIDE 20 MG/ML IJ SOLN
INTRAMUSCULAR | Status: DC | PRN
  Administered 2018-03-01: 100 mg via INTRAVENOUS

## 2018-03-01 MED ORDER — OXYCODONE HCL 5 MG PO TABS
5.0000 mg | ORAL_TABLET | ORAL | Status: DC | PRN
  Administered 2018-03-01: 5 mg via ORAL
  Filled 2018-03-01: qty 1

## 2018-03-01 MED ORDER — ONDANSETRON HCL 4 MG/2ML IJ SOLN: 4.0000 mg | Freq: Once | INTRAMUSCULAR | Status: DC | PRN

## 2018-03-01 MED ORDER — BUPIVACAINE LIPOSOME 1.3 % IJ SUSP
INTRAMUSCULAR | Status: AC
  Filled 2018-03-01: qty 20

## 2018-03-01 MED ORDER — LACTATED RINGERS IV SOLN
INTRAVENOUS | Status: DC
  Administered 2018-03-01: 12:00:00 via INTRAVENOUS

## 2018-03-01 MED ORDER — PHENYLEPHRINE 40 MCG/ML (10ML) SYRINGE FOR IV PUSH (FOR BLOOD PRESSURE SUPPORT)
PREFILLED_SYRINGE | INTRAVENOUS | Status: AC
  Filled 2018-03-01: qty 10

## 2018-03-01 MED ORDER — ONDANSETRON HCL 4 MG/2ML IJ SOLN
INTRAMUSCULAR | Status: AC
  Filled 2018-03-01: qty 2

## 2018-03-01 MED ORDER — CHLORHEXIDINE GLUCONATE CLOTH 2 % EX PADS
6.0000 | MEDICATED_PAD | Freq: Every day | CUTANEOUS | Status: DC
  Administered 2018-03-01: 6 via TOPICAL

## 2018-03-01 SURGICAL SUPPLY — 37 items
BLADE SURG 15 STRL LF DISP TIS (BLADE) ×1 IMPLANT
BLADE SURG 15 STRL SS (BLADE) ×1
CHLORAPREP W/TINT 26ML (MISCELLANEOUS) ×2 IMPLANT
CLOTH BEACON ORANGE TIMEOUT ST (SAFETY) ×2 IMPLANT
COVER LIGHT HANDLE STERIS (MISCELLANEOUS) ×4 IMPLANT
DERMABOND ADVANCED (GAUZE/BANDAGES/DRESSINGS) ×1
DERMABOND ADVANCED .7 DNX12 (GAUZE/BANDAGES/DRESSINGS) ×1 IMPLANT
DRSG TEGADERM 4X4.75 (GAUZE/BANDAGES/DRESSINGS) ×4 IMPLANT
ELECT REM PT RETURN 9FT ADLT (ELECTROSURGICAL) ×2
ELECTRODE REM PT RTRN 9FT ADLT (ELECTROSURGICAL) ×1 IMPLANT
GAUZE SPONGE 4X4 12PLY STRL (GAUZE/BANDAGES/DRESSINGS) ×2 IMPLANT
GAUZE SPONGE 4X4 16PLY XRAY LF (GAUZE/BANDAGES/DRESSINGS) ×2 IMPLANT
GLOVE BIO SURGEON STRL SZ 6.5 (GLOVE) ×2 IMPLANT
GLOVE BIOGEL PI IND STRL 6.5 (GLOVE) ×1 IMPLANT
GLOVE BIOGEL PI IND STRL 7.0 (GLOVE) ×3 IMPLANT
GLOVE BIOGEL PI INDICATOR 6.5 (GLOVE) ×1
GLOVE BIOGEL PI INDICATOR 7.0 (GLOVE) ×3
GLOVE ECLIPSE 6.5 STRL STRAW (GLOVE) ×2 IMPLANT
GOWN STRL REUS W/ TWL LRG LVL3 (GOWN DISPOSABLE) ×2 IMPLANT
GOWN STRL REUS W/TWL LRG LVL3 (GOWN DISPOSABLE) ×4 IMPLANT
INST SET MINOR GENERAL (KITS) ×2 IMPLANT
KIT TURNOVER KIT A (KITS) ×2 IMPLANT
MANIFOLD NEPTUNE II (INSTRUMENTS) ×2 IMPLANT
MESH VENTRALEX ST 8CM LRG (Mesh General) ×2 IMPLANT
NEEDLE HYPO 22GX1.5 SAFETY (NEEDLE) ×2 IMPLANT
NS IRRIG 500ML POUR BTL (IV SOLUTION) ×2 IMPLANT
PACK MINOR (CUSTOM PROCEDURE TRAY) ×2 IMPLANT
PAD ARMBOARD 7.5X6 YLW CONV (MISCELLANEOUS) ×2 IMPLANT
SET BASIN LINEN APH (SET/KITS/TRAYS/PACK) ×2 IMPLANT
SPONGE GAUZE 2X2 8PLY STRL LF (GAUZE/BANDAGES/DRESSINGS) ×2 IMPLANT
STRIP CLOSURE SKIN 1/4X3 (GAUZE/BANDAGES/DRESSINGS) ×2 IMPLANT
SUT ETHIBOND NAB MO 7 #0 18IN (SUTURE) ×4 IMPLANT
SUT MNCRL AB 4-0 PS2 18 (SUTURE) ×2 IMPLANT
SUT SILK 3 0 SH CR/8 (SUTURE) ×2 IMPLANT
SUT VIC AB 3-0 SH 27 (SUTURE) ×3
SUT VIC AB 3-0 SH 27X BRD (SUTURE) ×3 IMPLANT
SYR 20CC LL (SYRINGE) ×2 IMPLANT

## 2018-03-01 NOTE — Op Note (Signed)
[facility] Operative Note  03/01/18  Preoperative Diagnosis: Incarcerated umbilical hernia    Postoperative Diagnosis: Same   Procedure(s) Performed: Umbilical hernia repair with mesh    Surgeon: Leatrice Jewels. Henreitta Leber, MD   Assistants: No qualified resident was available    Anesthesia: General endotracheal   Anesthesiologist: Dr. Sharee Pimple     Specimens: None    Estimated Blood Loss: Minimal   Blood Replacement: None    Complications: None   Wound Class: Clean    Operative Indications: Mark Conway is a 62 yo with a chronic umbilical hernia that came to the ED yesterday with it incarcerated and causing obstructive symptoms with nausea /vomiting. I was able to get the hernia reduced but given his history and recurrent episodes of what sounded like obstructive symptoms, we admitted him for repair today. Overnight he did well and had no issues. We discussed this risk and benefits of repair including but not limited to bleeding, infection, risk of injury to bowel, need for mesh use if possible, and risk of recurrence. Mark Conway opted to proceed.   Findings:Umbilical hernia containing omental fat; small bowel ran with area that was scarred but patent, adjacent piece of fat on small bowel necrotic; no other signs of compressed bowel or bowel injury    Procedure: The patient was taken to the operating room and placed supine. General endotracheal anesthesia was induced. Intravenous antibiotics were  administered per protocol.  An orogastric tube positioned to decompress the stomach. The abdomen was prepared and draped in the usual sterile fashion.   A periumbilical  incision was made and carried down into the subcutaneous tissue around the hernia sac. The inferior portion of the hernia sac was dissected down to the fascia, and it was clear that this sac was large and would need to be dissected out.  With great care, the sac was opened and omental fat was dissected off the sac with  electrocautery. Care was taken to dissect the sac off the fascial edges, ensuring no bowel was injured. After completely dissecting off the sac, the hernia mouth was about 5X3cm.  The small bowel that had been reduced was ran and a scarred area of small bowel was noted but the lumen was over two fingerbreadths wide on palpation.  There was no need for any resection.  An adjacent piece of fat on the edge of the mesentery was necrotic and this was dissected off carefully. The bowel was healthy and without injury or necrosis.  The bowel was placed back into the abdomen. A 8cm circular mesh was placed intraabdominal and secured to the edges of fascia with 0 Ethibond. The fascia was closed horizontally without tension with interrupted 0 Ethibond.  The subcutaneous space was closed with 3-0 vicryl. The redundant umbilical skin was removed in attempts to recreate an umbilicus. The center of the remaining skin was tacked down to the inferior tissue. The deep dermal layer was also closed with 3-0 Vicryl interrupted suture. Xparel was placed in the wound. The skin was closed with 4-0 Monocryl and steri strips were placed. A 2X2 gauze was placed in the umbilicus and 4X4 gauze was placed over this with a Tegaderm covering.    Final inspection revealed acceptable hemostasis.  All counts were correct at the end of the case. The patient was awakened from anesthesia and extubate without complication.  The patient went to the PACU in stable condition.   Algis Greenhouse, MD [facility] [address] [place],  [place] [address] [phone] (office)

## 2018-03-01 NOTE — Discharge Instructions (Signed)
Discharge Instructions: Leave bandage in place until 03/03/2018.  Expect some drainage/ staining of the bandage.  If bandage is soaked through/ saturated, you can remove it and replace it with a clean bandage. On 03/03/2018 remove the gauze and bandage, and leave the steri-strips (tiny strips across the incision).   Shower per your regular routine after 03/03/2018.   Do not pick at the steri-strips, they will fall off on their own.  If they peel off this is ok.  Keep the area covered with a dry, clean gauze bandage daily.  Take tylenol and ibuprofen as needed for pain control, alternating every 4-6 hours.  Take Roxicodone for breakthrough pain. Take colace for constipation related to narcotic pain medication. Diet as tolerated, staying hydrated is the most important thing in the next few days.  Call clinic with work note needs / plan for returning to work.  No lifting > 10 lbs, excessive squatting, bending, pushing, pulling for the next 6-8 weeks.    Open Hernia Repair, Adult, Care After These instructions give you information about caring for yourself after your procedure. Your doctor may also give you more specific instructions. If you have problems or questions, contact your doctor. Follow these instructions at home: Surgical cut (incision) care   Follow instructions from your doctor about how to take care of your surgical cut area. Make sure you: ? Wash your hands with soap and water before you change your bandage (dressing). If you cannot use soap and water, use hand sanitizer. ? Change your bandage as told by your doctor. ? Leave stitches (sutures), skin glue, or skin tape (adhesive) strips in place. They may need to stay in place for 2 weeks or longer. If tape strips get loose and curl up, you may trim the loose edges. Do not remove tape strips completely unless your doctor says it is okay.  Check your surgical cut every day for signs of infection. Check for: ? More redness,  swelling, or pain. ? More fluid or blood. ? Warmth. ? Pus or a bad smell. Activity  Do not drive or use heavy machinery while taking prescription pain medicine. Do not drive until your doctor says it is okay.  Until your doctor says it is okay: ? Do not lift anything that is heavier than 10 lb (4.5 kg). ? Do not play contact sports.  Return to your normal activities as told by your doctor. Ask your doctor what activities are safe. General instructions  To prevent or treat having a hard time pooping (constipation) while you are taking prescription pain medicine, your doctor may recommend that you: ? Drink enough fluid to keep your pee (urine) clear or pale yellow. ? Take over-the-counter or prescription medicines. ? Eat foods that are high in fiber, such as fresh fruits and vegetables, whole grains, and beans. ? Limit foods that are high in fat and processed sugars, such as fried and sweet foods.  Take over-the-counter and prescription medicines only as told by your doctor.  Do not take baths, swim, or use a hot tub until your doctor says it is okay.  Keep all follow-up visits as told by your doctor. This is important. Contact a doctor if:  You develop a rash.  You have more redness, swelling, or pain around your surgical cut.  You have more fluid or blood coming from your surgical cut.  Your surgical cut feels warm to the touch.  You have pus or a bad smell coming from your surgical cut.  You have a fever or chills.  You have blood in your poop (stool).  You have not pooped in 2-3 days.  Medicine does not help your pain. Get help right away if:  You have chest pain or you are short of breath.  You feel light-headed.  You feel weak and dizzy (feel faint).  You have very bad pain.  You throw up (vomit) and your pain is worse. This information is not intended to replace advice given to you by your health care provider. Make sure you discuss any questions you have  with your health care provider. Document Released: 04/29/2014 Document Revised: 10/27/2015 Document Reviewed: 09/20/2015 Elsevier Interactive Patient Education  [employer].

## 2018-03-01 NOTE — Transfer of Care (Signed)
Immediate Anesthesia Transfer of Care Note  Patient: Mark Conway  Procedure(s) Performed: UMBILICAL HERNIA REPAIR WITH MESH (N/A )  Patient Location: PACU  Anesthesia Type:General  Level of Consciousness: awake, alert , oriented and patient cooperative  Airway & Oxygen Therapy: Patient Spontanous Breathing and Patient connected to nasal cannula oxygen  Post-op Assessment: Report given to RN and Post -op Vital signs reviewed and stable  Post vital signs: Reviewed and stable  Last Vitals:  Vitals Value Taken Time  BP    Temp    Pulse 76 03/01/2018  1:36 PM  Resp 17 03/01/2018  1:36 PM  SpO2 90 % 03/01/2018  1:36 PM  Vitals shown include unvalidated device data.  Last Pain:  Vitals:   03/01/18 1000  TempSrc:   PainSc: 0-No pain         Complications: No apparent anesthesia complications

## 2018-03-01 NOTE — Anesthesia Postprocedure Evaluation (Signed)
Anesthesia Post Note  Patient: Desmin Daleo  Procedure(s) Performed: UMBILICAL HERNIA REPAIR WITH MESH (N/A )  Patient location during evaluation: PACU Anesthesia Type: General Level of consciousness: awake and alert Pain management: pain level controlled Vital Signs Assessment: post-procedure vital signs reviewed and stable Respiratory status: spontaneous breathing, nonlabored ventilation, respiratory function stable and patient connected to nasal cannula oxygen Cardiovascular status: blood pressure returned to baseline and stable Postop Assessment: no apparent nausea or vomiting Anesthetic complications: no     Last Vitals:  Vitals:   02/28/18 2045 03/01/18 0514  BP: (!) 139/92 117/83  Pulse: 77 65  Resp: 18 18  Temp: 36.9 C 36.6 C  SpO2: 96% 98%    Last Pain:  Vitals:   03/01/18 1000  TempSrc:   PainSc: 0-No pain                 Shona Needles

## 2018-03-01 NOTE — Anesthesia Preprocedure Evaluation (Signed)
Anesthesia Evaluation  Patient identified by MRN, date of birth, ID band Patient awake    Reviewed: Allergy & Precautions, H&P , NPO status , Patient's Chart, lab work & pertinent test results  Airway Mallampati: II  TM Distance: >3 FB Neck ROM: full    Dental no notable dental hx.    Pulmonary neg pulmonary ROS, former smoker,    Pulmonary exam normal breath sounds clear to auscultation       Cardiovascular Exercise Tolerance: Good negative cardio ROS   Rhythm:regular Rate:Normal     Neuro/Psych negative neurological ROS  negative psych ROS   GI/Hepatic negative GI ROS, Neg liver ROS,   Endo/Other  negative endocrine ROS  Renal/GU negative Renal ROS  negative genitourinary   Musculoskeletal   Abdominal   Peds  Hematology negative hematology ROS (+)   Anesthesia Other Findings   Reproductive/Obstetrics negative OB ROS                             Anesthesia Physical Anesthesia Plan  ASA: II  Anesthesia Plan: General   Post-op Pain Management:    Induction:   PONV Risk Score and Plan:   Airway Management Planned:   Additional Equipment:   Intra-op Plan:   Post-operative Plan:   Informed Consent: I have reviewed the patients History and Physical, chart, labs and discussed the procedure including the risks, benefits and alternatives for the proposed anesthesia with the patient or authorized representative who has indicated his/her understanding and acceptance.   Dental Advisory Given  Plan Discussed with: CRNA  Anesthesia Plan Comments:         Anesthesia Quick Evaluation

## 2018-03-01 NOTE — Anesthesia Procedure Notes (Signed)
Procedure Name: Intubation Date/Time: 03/01/2018 12:13 PM Performed by: Nicanor Alcon, MD Pre-anesthesia Checklist: Patient identified, Patient being monitored, Timeout performed, Emergency Drugs available and Suction available Patient Re-evaluated:Patient Re-evaluated prior to induction Oxygen Delivery Method: Circle System Utilized Preoxygenation: Pre-oxygenation with 100% oxygen Induction Type: IV induction Ventilation: Mask ventilation without difficulty Laryngoscope Size: Mac and 4 Grade View: Grade I Tube type: Oral Tube size: 8.0 mm Number of attempts: 1 Airway Equipment and Method: Stylet Placement Confirmation: ETT inserted through vocal cords under direct vision,  positive ETCO2 and breath sounds checked- equal and bilateral Secured at: 22 cm Tube secured with: Tape Dental Injury: Teeth and Oropharynx as per pre-operative assessment

## 2018-03-01 NOTE — Interval H&P Note (Signed)
History and Physical Interval Note:  03/01/2018 11:22 AM  Mark Conway  has presented today for surgery, with the diagnosis of incarcerated umbilical hernia  The various methods of treatment have been discussed with the patient and family. After consideration of risks, benefits and other options for treatment, the patient has consented to  Procedure(s): HERNIA REPAIR UMBILICAL ADULT with mesh (N/A) as a surgical intervention .  The patient's history has been reviewed, patient examined, no change in status, stable for surgery.  I have reviewed the patient's chart and labs.  Questions were answered to the patient's satisfaction.    Questions about work after. Will need restrictions. Hernia remains reduced. No issues overnight.   Lucretia Roers

## 2018-03-01 NOTE — Progress Notes (Signed)
Updated son by phone. Patient needs to eat, drink/ pain control prior to discharge. If is able to do this, will plan for d/c later today.   Algis Greenhouse, MD [facility] [address] [place], [place] [address] [phone] (office)

## 2018-03-01 NOTE — Progress Notes (Signed)
Removed IV-clean, dry, intact. Reviewed d/c paperwork with patient, son, and father. Discussed aftercare. Answered all questions. Wheeled stable patient and belongings to main entrance where he was picked up by his father.

## 2018-03-02 ENCOUNTER — Encounter (HOSPITAL_COMMUNITY): Payer: Self-pay | Admitting: General Surgery

## 2018-03-02 LAB — HIV ANTIBODY (ROUTINE TESTING W REFLEX): HIV SCREEN 4TH GENERATION: NONREACTIVE

## 2018-03-02 NOTE — Discharge Summary (Signed)
Physician Discharge Summary  Patient ID: Mark Conway MRN: [id number] DOB/AGE: [birth year] 62 y.o.  Admit date: 02/28/2018 Discharge date: 03/01/2018 Admission Diagnoses:  Discharge Diagnoses:  Active Problems:   Periumbilical hernia   Umbilical hernia   Discharged Condition: good  Hospital Course: [name] presented to the hospital 02/28/2018 with an incarcerated umbilical hernia that was reduced. Given his obstructive symptoms and the chronicity, he was taken to the OR for repair 03/01/2018. He did well post operatively and was eating and having pain control.    Consults: None  Significant Diagnostic Studies: CT a/p with incarcerated umbilical hernia with obstruction proximal  Treatments: IV hydration and Reduction of hernia in ED: 03/01/2018 Umbilical hernia repair with mesh  Discharge Exam: Blood pressure 128/82, pulse 68, temperature 97.8 F (36.6 C), resp. rate 19, height 6\' 1"  (1.854 m), weight 110.5 kg, SpO2 93 %. General appearance: alert, cooperative and no distress Resp: normal work breathing GI: soft, appropriately tender, dressing in place  Disposition:   Discharge Instructions    Call MD for:  difficulty breathing, headache or visual disturbances   Complete by:  As directed    Call MD for:  extreme fatigue   Complete by:  As directed    Call MD for:  persistant dizziness or light-headedness   Complete by:  As directed    Call MD for:  persistant nausea and vomiting   Complete by:  As directed    Call MD for:  redness, tenderness, or signs of infection (pain, swelling, redness, odor or green/yellow discharge around incision site)   Complete by:  As directed    Call MD for:  severe uncontrolled pain   Complete by:  As directed    Call MD for:  temperature >100.4   Complete by:  As directed    Diet - low sodium heart healthy   Complete by:  As directed    Increase activity slowly   Complete by:  As directed      Allergies as of 03/01/2018   No Known  Allergies     Medication List    TAKE these medications   aspirin 81 MG tablet Take 81 mg by mouth daily.   calcium carbonate 600 MG Tabs tablet Commonly known as:  OS-CAL Take 600 mg by mouth daily with breakfast.   cholecalciferol 1000 units tablet Commonly known as:  VITAMIN D Take 1,000 Units by mouth 2 (two) times daily.   docusate sodium 100 MG capsule Commonly known as:  COLACE Take 1 capsule (100 mg total) by mouth 2 (two) times daily.   Fish Oil 1000 MG Caps Take 2 capsules by mouth daily. PT takes 2 tablets daily   glucosamine-chondroitin 500-400 MG tablet Take 2 tablets by mouth daily.   oxyCODONE 5 MG immediate release tablet Commonly known as:  Oxy IR/ROXICODONE Take 1 tablet (5 mg total) by mouth every 4 (four) hours as needed for moderate pain.   sertraline 50 MG tablet Commonly known as:  ZOLOFT Take 50 mg by mouth daily.   TURMERIC PO Take 1 tablet by mouth daily.      Follow-up Information    Lucretia Roers, MD On 03/10/2018.   Specialty:  General Surgery Contact information: [address] [place] [address] [phone]           Signed: Lucretia Roers 03/02/2018, 9:08 AM

## 2018-03-05 ENCOUNTER — Ambulatory Visit: Payer: BC Managed Care – PPO | Admitting: General Surgery

## 2018-03-10 ENCOUNTER — Ambulatory Visit: Payer: BC Managed Care – PPO | Admitting: General Surgery

## 2018-03-10 ENCOUNTER — Encounter: Payer: Self-pay | Admitting: General Surgery

## 2018-03-10 ENCOUNTER — Ambulatory Visit (INDEPENDENT_AMBULATORY_CARE_PROVIDER_SITE_OTHER): Payer: Self-pay | Admitting: General Surgery

## 2018-03-10 VITALS — BP 137/79 | HR 69 | Temp 96.8°F | Resp 18 | Wt 252.2 lb

## 2018-03-10 DIAGNOSIS — K420 Umbilical hernia with obstruction, without gangrene: Secondary | ICD-10-CM

## 2018-03-10 NOTE — Progress Notes (Signed)
[facility] Note   HPI:  62 y.o. Male presents to clinic for post-op follow-up evaluation of after umbilical hernia repair. Patient reports he is doing well. He has been putting honey on the incision.  Review of Systems:  No fevers or chills No drainage All other review of systems: otherwise negative   Vital Signs:  BP 137/79 (BP Location: Left Arm, Patient Position: Sitting, Cuff Size: Large)   Pulse 69   Temp (!) 96.8 F (36 C) (Temporal)   Resp 18   Wt 252 lb 3.2 oz (114.4 kg)   BMI 33.27 kg/m    Physical Exam:  Physical Exam  Constitutional: He appears well-developed.  HENT:  Head: Normocephalic.  Eyes: Pupils are equal, round, and reactive to light.  Cardiovascular: Normal rate.  Pulmonary/Chest: Effort normal.  Abdominal: Soft. He exhibits no distension. There is no tenderness.  Healing wound periumbilical, no erythema or drainage, sticky from honey  Musculoskeletal: Normal range of motion.  Vitals reviewed.    Assessment:  62 y.o. yo Male s/p umbilical hernia repair with mesh. Doing well. Wound looks fine. Told him if he ever uses honey to use medical grade for wounds as others have bacteria/ spores in them.   Plan:  No heavy lifting > 10 lbs, no excessive squatting, bending, pushing, pulling for the next 6 weeks. Roll on your side when getting in and out of bed to prevent strain on your abdomen.  Follow up PRN Note written with restrictions for work/ works as Fish farm managerschool janitor [employer]   All of the above recommendations were discussed with the patient, and all of patient's questions were answered to his expressed satisfaction.  Algis GreenhouseLindsay Bridges, MD [facility] [address] [place], [place] [id number] [phone] (office)

## 2018-03-10 NOTE — Patient Instructions (Addendum)
No heavy lifting > 10 lbs, no excessive squatting, bending, pushing, pulling for the next 6 weeks (04/12/2018). Roll on your side when getting in and out of bed to prevent strain on your abdomen.  Follow up as needed with issues.

## 2018-03-12 ENCOUNTER — Ambulatory Visit: Payer: BC Managed Care – PPO | Admitting: General Surgery

## 2019-03-25 ENCOUNTER — Other Ambulatory Visit: Payer: Self-pay | Admitting: Family Medicine

## 2019-03-25 ENCOUNTER — Other Ambulatory Visit: Payer: Self-pay

## 2019-03-25 ENCOUNTER — Encounter: Payer: Self-pay | Admitting: Family Medicine

## 2019-03-25 ENCOUNTER — Ambulatory Visit: Payer: BC Managed Care – PPO | Admitting: Family Medicine

## 2019-03-25 VITALS — BP 119/79 | HR 82 | Temp 97.5°F | Ht 73.0 in | Wt 254.0 lb

## 2019-03-25 DIAGNOSIS — E7849 Other hyperlipidemia: Secondary | ICD-10-CM | POA: Insufficient documentation

## 2019-03-25 DIAGNOSIS — N521 Erectile dysfunction due to diseases classified elsewhere: Secondary | ICD-10-CM | POA: Insufficient documentation

## 2019-03-25 MED ORDER — VARDENAFIL HCL 20 MG PO TABS: 20.0000 mg | ORAL_TABLET | ORAL | 5 refills | Status: DC

## 2019-03-25 NOTE — Progress Notes (Signed)
New Patient Office Visit  Subjective:  Patient ID: Dillard Pascal, male    DOB: 01/05/1956  Age: 63 y.o. MRN: [id number]  CC:  Chief Complaint  Patient presents with  . Establish Care  ED/hyperlipidemia  HPI Selvin Yun presents for  Wife(COPD) died 3 months ago-mason /shriner-support system Hyperlipidemia-started back taking Crestor 3 weeks ago, omega 3 fatty acid Depression-no longer taking zoloft ED-has used medications  in the past Past Medical History:  Diagnosis Date  . Anxiety   . Hernia of abdominal wall     Past Surgical History:  Procedure Laterality Date  . COLONOSCOPY N/A 09/03/2013   Procedure: COLONOSCOPY;  Surgeon: Daneil Dolin, MD;  Location: [facility];  Service: Endoscopy;  Laterality: N/A;  7:30 AM  . FRACTURE SURGERY Right    right arm,   63 years   old  . HERNIA REPAIR    . UMBILICAL HERNIA REPAIR N/A 03/01/2018   Procedure: UMBILICAL HERNIA REPAIR WITH MESH;  Surgeon: Virl Cagey, MD;  Location: [facility] ORS;  Service: General;  Laterality: N/A;    Family History  Problem Relation Age of Onset  . Dementia Mother   . Hyperlipidemia Mother   . Heart attack Father   . Heart disease Father   . Healthy Sister     Social History  Lives alone-daughter, grandkids, made jewelry-rental property  Quit smoking 19years ago Socioeconomic History  . Marital status: Married    Spouse name: Not on file  . Number of children: Not on file  . Years of education: Not on file  . Highest education level: Not on file  Occupational History  . Occupation: custodian  Scientific laboratory technician  . Financial resource strain: Not on file  . Food insecurity    Worry: Not on file    Inability: Not on file  . Transportation needs    Medical: Not on file    Non-medical: Not on file  Tobacco Use  . Smoking status: Former Smoker    Quit date: 08/04/2013    Years since quitting: 5.6  . Smokeless tobacco: Never Used  Substance and Sexual Activity  . Alcohol use: No     Comment: rarely  . Drug use: No  . Sexual activity: Yes  Lifestyle  . Physical activity    Days per week: Not on file    Minutes per session: Not on file  . Stress: Not on file  Relationships  . Social Herbalist on phone: Not on file    Gets together: Not on file    Attends religious service: Not on file    Active member of club or organization: Not on file    Attends meetings of clubs or organizations: Not on file    Relationship status: Not on file  . Intimate partner violence    Fear of current or ex partner: Not on file    Emotionally abused: Not on file    Physically abused: Not on file    Forced sexual activity: Not on file  Other Topics Concern  . Not on file  Social History Narrative  . Not on file    ROS Review of Systems  Constitutional: Negative.   HENT: Negative.   Respiratory:       COPD  Cardiovascular: Negative.   Gastrointestinal: Negative.   Genitourinary:       ED  Musculoskeletal: Negative.   Skin: Negative.   Allergic/Immunologic: Negative.   Neurological: Negative.   Hematological:  Negative.     Objective:   Today's Vitals: BP 119/79 (BP Location: Left Arm, Patient Position: Sitting, Cuff Size: Normal)   Pulse 82   Temp (!) 97.5 F (36.4 C) (Oral)   Ht 6\' 1"  (1.854 m)   Wt 254 lb (115.2 kg)   SpO2 98%   BMI 33.51 kg/m   Physical Exam Constitutional:      Appearance: Normal appearance.  HENT:     Head: Normocephalic and atraumatic.  Neck:     Musculoskeletal: Normal range of motion and neck supple.  Cardiovascular:     Rate and Rhythm: Normal rate and regular rhythm.     Pulses: Normal pulses.  Neurological:     Mental Status: He is alert and oriented to person, place, and time.     Assessment & Plan:    Outpatient Encounter Medications as of 03/25/2019  Medication Sig  . aspirin 81 MG tablet Take 81 mg by mouth daily.  . calcium carbonate (OS-CAL) 600 MG TABS tablet Take 600 mg by mouth daily with breakfast.   . cholecalciferol (VITAMIN D) 1000 UNITS tablet Take 1,000 Units by mouth 2 (two) times daily.  14/06/2018 glucosamine-chondroitin 500-400 MG tablet Take 2 tablets by mouth daily.   . Omega-3 Fatty Acids (FISH OIL) 1000 MG CAPS Take 2 capsules by mouth daily. PT takes 2 tablets daily  . rosuvastatin (CRESTOR) 5 MG tablet Take 5 mg by mouth daily.  . sertraline (ZOLOFT) 50 MG tablet Take 50 mg by mouth daily.  . sildenafil (REVATIO) 20 MG tablet SMARTSIG:0-5 Tablet(s) By Mouth Daily PRN  . tadalafil (CIALIS) 5 MG tablet Take 5 mg by mouth daily.  . TURMERIC PO Take 1 tablet by mouth daily.  . vardenafil (LEVITRA) 20 MG tablet Take 20 mg by mouth as directed.   No facility-administered encounter medications on file as of 03/25/2019.   1. Other hyperlipidemia crestor-rx, lft, lipid panel 11/20  2. Erectile disorder due to medical condition in male levitra-rx  Follow-up: yearly  Hughie Melroy 02-24-1977, MD

## 2019-03-25 NOTE — Patient Instructions (Signed)
Urology referral if pt calls for additional information/exam

## 2019-03-25 NOTE — Telephone Encounter (Signed)
Requested medication (s) are due for refill today: yes  Requested medication (s) are on the active medication list: yes  Last refill:  03/25/2019  Future visit scheduled: no  Notes to clinic:  Alternative Requested:WILL COVER SILDENAPHIL   Requested Prescriptions  Pending Prescriptions Disp Refills   sildenafil (VIAGRA) 100 MG tablet [Pharmacy Med Name: SILDENAFIL 100 MG TABLET] 10 tablet 0    Sig: Please specify directions, refills and quantity     There is no refill protocol information for this order

## 2019-03-27 NOTE — Telephone Encounter (Signed)
Pt request Levitra at appt. Please call pt about substitution as discussed. Insurance preferred.

## 2019-03-29 ENCOUNTER — Other Ambulatory Visit: Payer: Self-pay | Admitting: Family Medicine

## 2019-03-29 ENCOUNTER — Telehealth: Payer: Self-pay

## 2019-03-29 DIAGNOSIS — N521 Erectile dysfunction due to diseases classified elsewhere: Secondary | ICD-10-CM

## 2019-03-29 MED ORDER — SILDENAFIL CITRATE 100 MG PO TABS: ORAL_TABLET | ORAL | 5 refills | Status: DC

## 2019-03-29 MED ORDER — VARDENAFIL HCL 20 MG PO TABS: 20.0000 mg | ORAL_TABLET | ORAL | 5 refills | Status: DC

## 2019-03-29 MED ORDER — VARDENAFIL HCL 20 MG PO TABS: 20.0000 mg | ORAL_TABLET | Freq: Every day | ORAL | 0 refills | Status: DC | PRN

## 2019-03-29 NOTE — Telephone Encounter (Signed)
Mark Conway, CMA  

## 2019-03-31 ENCOUNTER — Telehealth: Payer: Self-pay

## 2019-03-31 DIAGNOSIS — F419 Anxiety disorder, unspecified: Secondary | ICD-10-CM

## 2019-03-31 MED ORDER — SERTRALINE HCL 50 MG PO TABS: 50.0000 mg | ORAL_TABLET | Freq: Every day | ORAL | 0 refills | Status: AC

## 2019-03-31 NOTE — Telephone Encounter (Signed)
Mark Conway, CMA  

## 2019-04-01 ENCOUNTER — Telehealth: Payer: Self-pay | Admitting: Family Medicine

## 2019-04-01 ENCOUNTER — Other Ambulatory Visit: Payer: Self-pay | Admitting: Family Medicine

## 2019-04-01 DIAGNOSIS — N521 Erectile dysfunction due to diseases classified elsewhere: Secondary | ICD-10-CM

## 2019-04-01 NOTE — Telephone Encounter (Signed)
Pt is calling and would like the referral placed to urology as discussed at previous visit.

## 2019-04-01 NOTE — Telephone Encounter (Signed)
Routing to Dr. Corum for Advice? 

## 2019-05-21 ENCOUNTER — Ambulatory Visit: Payer: BC Managed Care – PPO | Admitting: Urology

## 2019-06-15 ENCOUNTER — Ambulatory Visit: Payer: BC Managed Care – PPO | Admitting: Urology

## 2019-06-22 ENCOUNTER — Ambulatory Visit: Payer: BC Managed Care – PPO | Admitting: Urology

## 2019-09-17 ENCOUNTER — Encounter (HOSPITAL_BASED_OUTPATIENT_CLINIC_OR_DEPARTMENT_OTHER): Payer: Self-pay | Admitting: Emergency Medicine

## 2019-09-17 ENCOUNTER — Emergency Department (HOSPITAL_BASED_OUTPATIENT_CLINIC_OR_DEPARTMENT_OTHER): Payer: BC Managed Care – PPO

## 2019-09-17 ENCOUNTER — Other Ambulatory Visit: Payer: Self-pay

## 2019-09-17 ENCOUNTER — Emergency Department (HOSPITAL_BASED_OUTPATIENT_CLINIC_OR_DEPARTMENT_OTHER)
Admission: EM | Admit: 2019-09-17 | Discharge: 2019-09-17 | Disposition: A | Discharge status: Home / Self Care | Payer: BC Managed Care – PPO | Attending: Emergency Medicine | Admitting: Emergency Medicine

## 2019-09-17 DIAGNOSIS — S20211A Contusion of right front wall of thorax, initial encounter: Secondary | ICD-10-CM

## 2019-09-17 DIAGNOSIS — Y92008 Other place in unspecified non-institutional (private) residence as the place of occurrence of the external cause: Secondary | ICD-10-CM | POA: Diagnosis not present

## 2019-09-17 DIAGNOSIS — Y9389 Activity, other specified: Secondary | ICD-10-CM | POA: Diagnosis not present

## 2019-09-17 DIAGNOSIS — W01198A Fall on same level from slipping, tripping and stumbling with subsequent striking against other object, initial encounter: Secondary | ICD-10-CM | POA: Insufficient documentation

## 2019-09-17 DIAGNOSIS — W19XXXA Unspecified fall, initial encounter: Secondary | ICD-10-CM

## 2019-09-17 DIAGNOSIS — Z87891 Personal history of nicotine dependence: Secondary | ICD-10-CM | POA: Diagnosis not present

## 2019-09-17 DIAGNOSIS — S299XXA Unspecified injury of thorax, initial encounter: Secondary | ICD-10-CM | POA: Diagnosis present

## 2019-09-17 DIAGNOSIS — Z79899 Other long term (current) drug therapy: Secondary | ICD-10-CM | POA: Diagnosis not present

## 2019-09-17 DIAGNOSIS — Y999 Unspecified external cause status: Secondary | ICD-10-CM | POA: Insufficient documentation

## 2019-09-17 MED ORDER — LIDOCAINE 5 % EX PTCH: 1.0000 | MEDICATED_PATCH | CUTANEOUS | 0 refills | Status: DC

## 2019-09-17 NOTE — Discharge Instructions (Signed)
You can take ibuprofen or tylenol, available over the counter according to label instructions as needed for pain.   

## 2019-09-17 NOTE — ED Provider Notes (Signed)
[facility] EMERGENCY DEPARTMENT Provider Note   CSN: [id number] Arrival date & time: 09/17/19  0737     History Chief Complaint  Patient presents with  . Fall  . Rib Injury    Mark Conway is a 64 y.o. male.  The history is provided by the patient and medical records. No language interpreter was used.  Fall   Mark Conway is a 64 y.o. male who presents to the Emergency Department complaining of fall, chest wall pain. He presents the emergency department for evaluation of injuries following a fall that occurred two days ago. He states that he was doing some work at his house when he tripped over a stepstool and fell onto his right side. He complains of pain to his right anterior chest wall that is worse with movement. He resolved when he is at rest. Symptoms were worse today and he had difficulty tying his shoe so he presents for evaluation. He denies any fevers, shortness of breath, abdominal pain, nausea, vomiting, leg swelling or pain. No prior similar symptoms. He has a history of hyperlipidemia, no additional medical problems. He works as a Arboriculturist.    Past Medical History:  Diagnosis Date  . Anxiety   . Hernia of abdominal wall     Patient Active Problem List   Diagnosis Date Noted  . Other hyperlipidemia 03/25/2019  . Erectile disorder due to medical condition in male 03/25/2019  . Periumbilical hernia 02/28/2018  . Umbilical hernia 02/28/2018  . Hernia of abdominal wall   . Anxiety   . Incarcerated umbilical hernia     Past Surgical History:  Procedure Laterality Date  . COLONOSCOPY N/A 09/03/2013   Procedure: COLONOSCOPY;  Surgeon: Corbin Ade, MD;  [facility];  Service: Endoscopy;  Laterality: N/A;  7:30 AM  . FRACTURE SURGERY Right    right arm,   64 years   old  . HERNIA REPAIR    . UMBILICAL HERNIA REPAIR N/A 03/01/2018   Procedure: UMBILICAL HERNIA REPAIR WITH MESH;  Surgeon: Lucretia Roers, MD;  Location: [facility]S;  Service:  General;  Laterality: N/A;       Family History  Problem Relation Age of Onset  . Dementia Mother   . Hyperlipidemia Mother   . Heart attack Father   . Heart disease Father   . Healthy Sister     Social History   Tobacco Use  . Smoking status: Former Smoker    Quit date: 08/04/2013    Years since quitting: 6.1  . Smokeless tobacco: Never Used  Substance Use Topics  . Alcohol use: No    Comment: rarely  . Drug use: No    Home Medications Prior to Admission medications   Medication Sig Start Date End Date Taking? Authorizing Provider  lidocaine (LIDODERM) 5 % Place 1 patch onto the skin daily. Remove & Discard patch within 12 hours or as directed by MD 09/17/19   Tilden Fossa, MD  rosuvastatin (CRESTOR) 5 MG tablet Take 5 mg by mouth daily. 03/08/19   [provider]  sertraline (ZOLOFT) 50 MG tablet Take 1 tablet (50 mg total) by mouth daily. 03/31/19   Corum, Minerva Fester, MD    Allergies    Patient has no known allergies.  Review of Systems   Review of Systems  All other systems reviewed and are negative.   Physical Exam Updated Vital Signs BP 107/70 (BP Location: Left Arm)   Pulse 73   Temp 98.6  F (37 C) (Oral)   Resp 16   Ht 6\' 1"  (1.854 m)   Wt 100.7 kg   SpO2 99%   BMI 29.29 kg/m   Physical Exam Vitals and nursing note reviewed.  Constitutional:      Appearance: He is well-developed.  HENT:     Head: Normocephalic and atraumatic.  Cardiovascular:     Rate and Rhythm: Normal rate and regular rhythm.     Heart sounds: No murmur.  Pulmonary:     Effort: Pulmonary effort is normal. No respiratory distress.     Breath sounds: Normal breath sounds.     Comments: Right anterior chest wall tenderness just below the right nipple without ecchymosis  Abdominal:     Palpations: Abdomen is soft.     Tenderness: There is no abdominal tenderness. There is no guarding or rebound.  Musculoskeletal:        General: No swelling or tenderness.      Cervical back: Neck supple.  Skin:    General: Skin is warm and dry.  Neurological:     Mental Status: He is alert and oriented to person, place, and time.  Psychiatric:        Behavior: Behavior normal.     ED Results / Procedures / Treatments   Labs (all labs ordered are listed, but only abnormal results are displayed) Labs Reviewed - No data to display  EKG None  Radiology DG Chest 2 View  Result Date: 09/17/2019 CLINICAL DATA:  Cough. EXAM: CHEST - 2 VIEW COMPARISON:  02/28/2018 FINDINGS: The lungs are clear without focal pneumonia, edema, pneumothorax or pleural effusion. The cardiopericardial silhouette is within normal limits for size. The visualized bony structures of the thorax are intact. IMPRESSION: No active cardiopulmonary disease. Electronically Signed   By: Misty Stanley M.D.   On: 09/17/2019 10:53   DG Ribs Unilateral W/Chest Right  Result Date: 09/17/2019 CLINICAL DATA:  Right rib pain after fall 2 days ago. EXAM: RIGHT RIBS AND CHEST - 3+ VIEW COMPARISON:  None. FINDINGS: No fracture or other bone lesions are seen involving the ribs. There is no evidence of pneumothorax or pleural effusion. Both lungs are clear. Heart size and mediastinal contours are within normal limits. IMPRESSION: Negative. Electronically Signed   By: Marijo Conception M.D.   On: 09/17/2019 11:38    Procedures Procedures (including critical care time)  Medications Ordered in ED Medications - No data to display  ED Course  I have reviewed the triage vital signs and the nursing notes.  Pertinent labs & imaging results that were available during my care of the patient were reviewed by me and considered in my medical decision making (see chart for details).    MDM Rules/Calculators/A&P                     Patient here for evaluation of right sided chest wall pain following a fall two days ago. He has reproducible tenderness on examination without any chest wall deformity. Imaging is negative  for acute fracture. Presentation is not consistent with significant intra-abdominal injury. Discussed with patient home care for chest wall contusion. Discussed outpatient follow-up and return precautions.  Final Clinical Impression(s) / ED Diagnoses Final diagnoses:  Contusion of right chest wall, initial encounter  Fall, initial encounter    Rx / DC Orders ED Discharge Orders         Ordered    lidocaine (LIDODERM) 5 %  Every 24 hours  09/17/19 1113           Tilden Fossa, MD 09/17/19 [phone]

## 2019-09-17 NOTE — ED Triage Notes (Signed)
Pt fell on Wednesday, injured right ribs.  Pt states pain is worsening.  No fever, occasional cough, some clear/whit sputum occasionally.  No sob.

## 2020-03-27 ENCOUNTER — Ambulatory Visit: Payer: BC Managed Care – PPO | Admitting: Family Medicine

## 2020-04-25 IMAGING — DX DG SHOULDER 2+V*R*
3 series · 3 of 3 positions shown · non-contrast
Comparison: None.

CLINICAL DATA: Right shoulder pain for 2-3 weeks

EXAM:
RIGHT SHOULDER - 2+ VIEW

[shoulder ap]
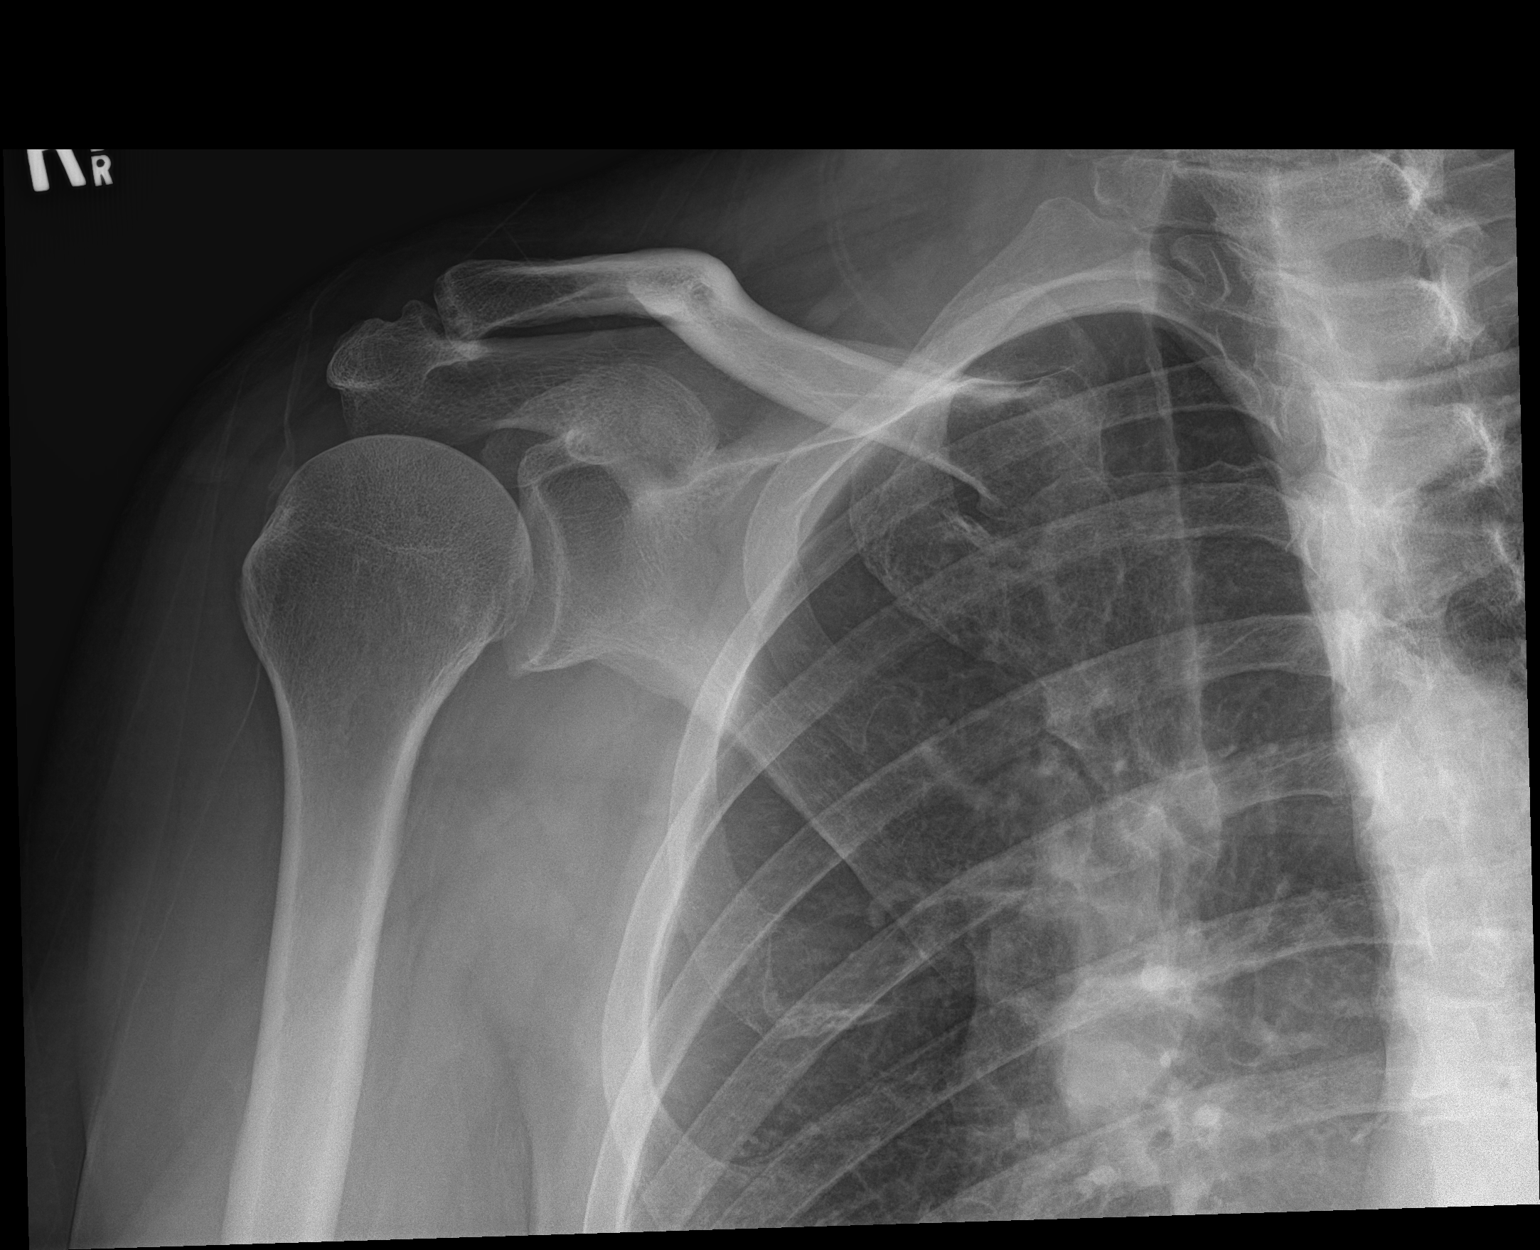

[shoulder y view]
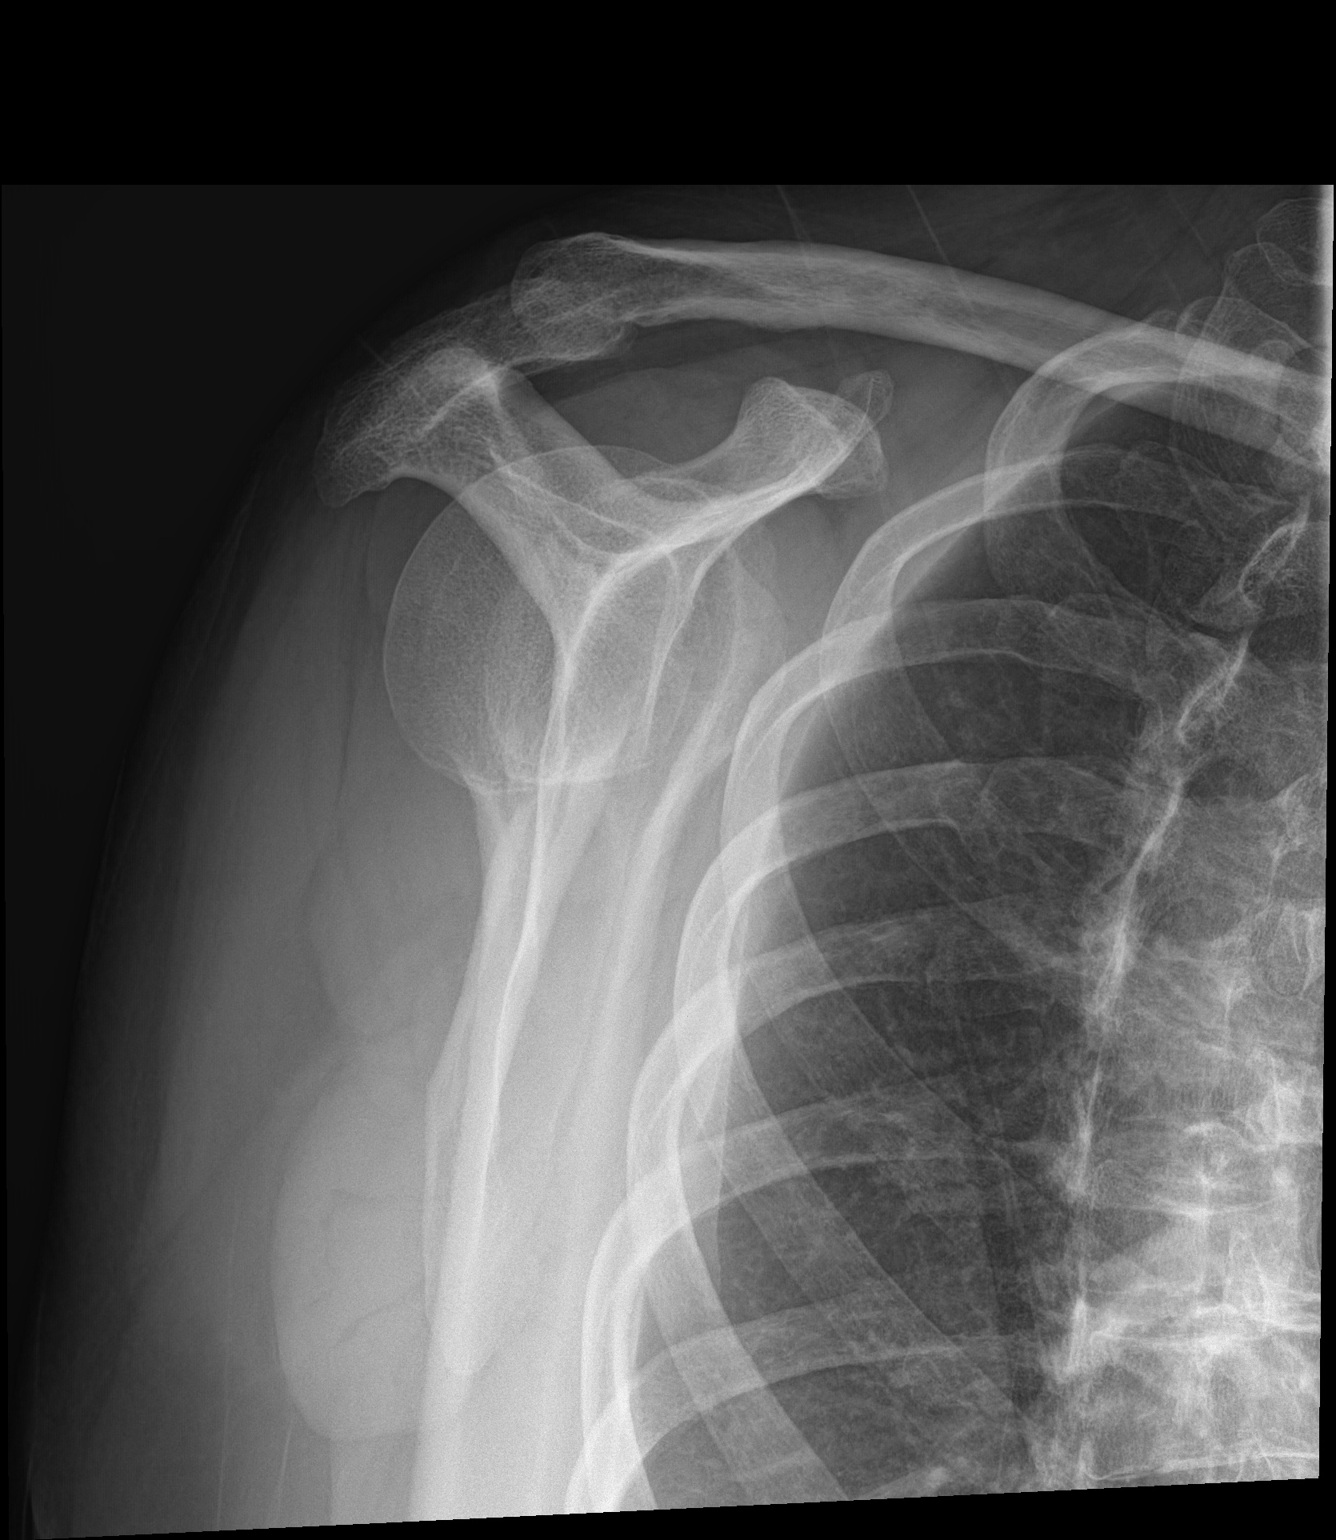

[shoulder axillary]
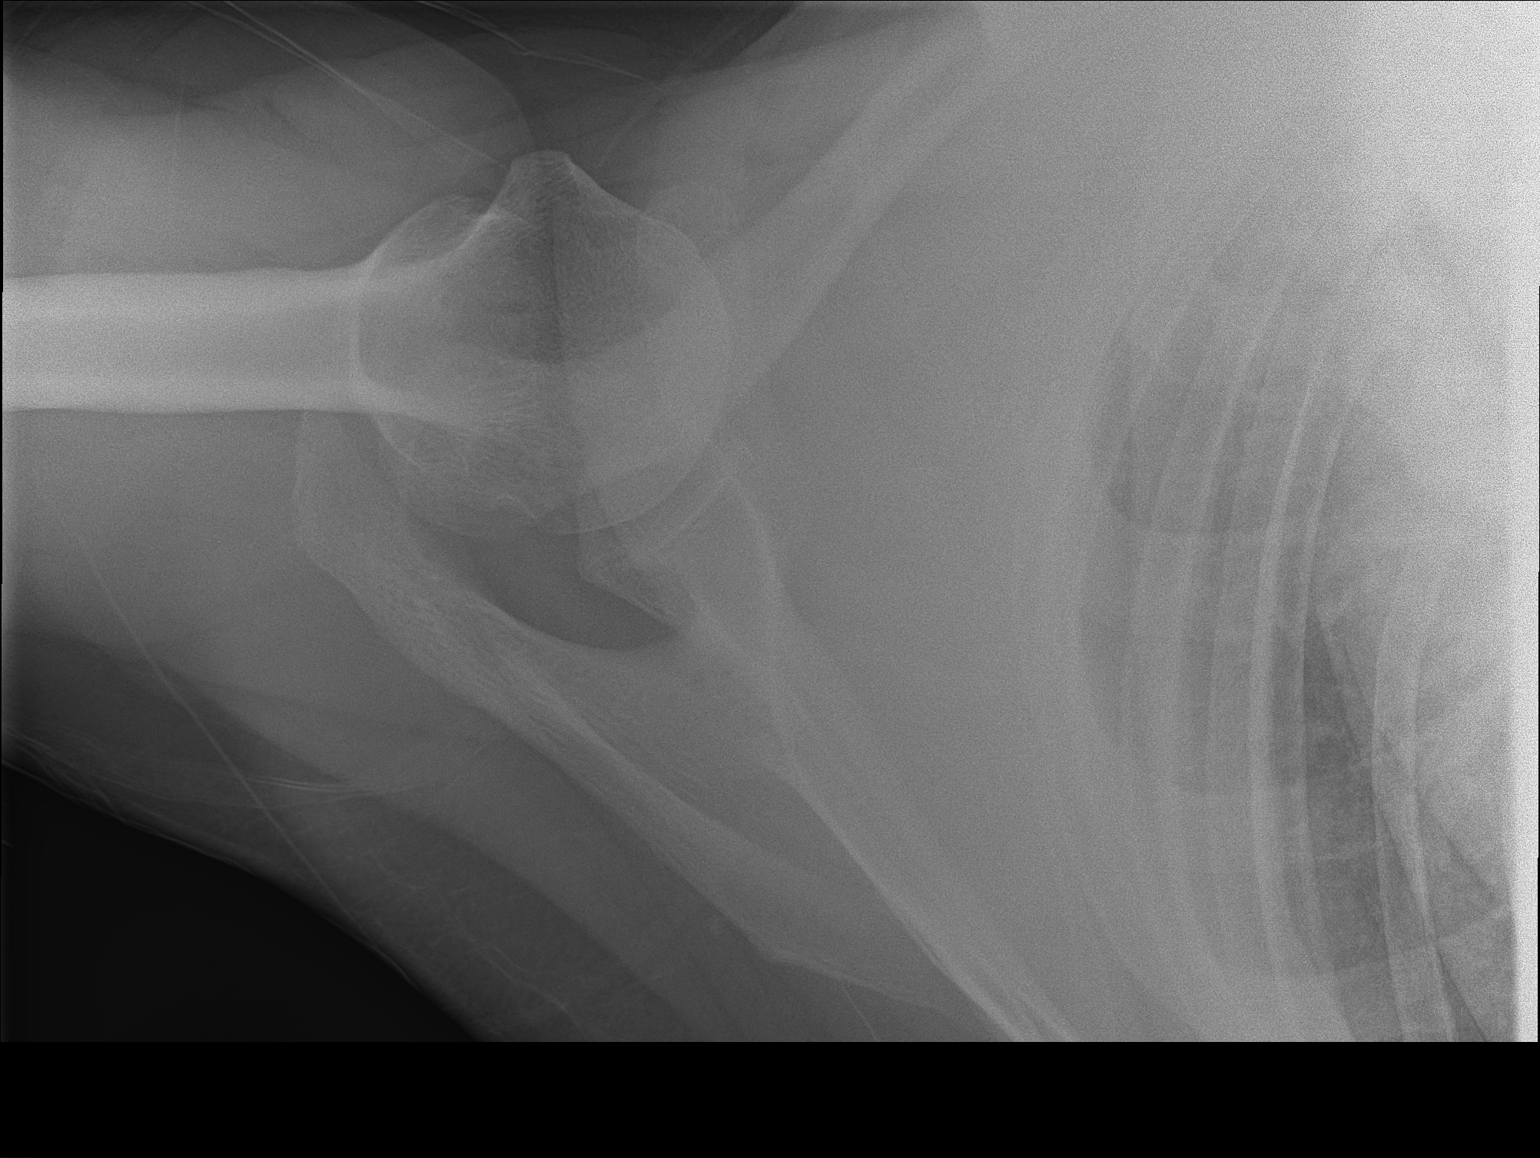

[3 of 3 positions shown; findings below may reference images not displayed]

FINDINGS: Degenerative changes in the AC joint with joint space narrowing and
spurring. Glenohumeral joint is maintained. No acute bony
abnormality. Specifically, no fracture, subluxation, or dislocation.
Soft tissues are intact.
IMPRESSION: Degenerative changes in the right AC joint. No acute bony
abnormality.

## 2021-06-29 NOTE — Progress Notes (Signed)
?Cardiology Office Note:   ? ?Date:  07/02/2021  ? ?ID:  Mark Conway, DOB 1955/06/30, MRN [id number] ? ?PCP:  Pcp, No  ? ?[facility] ?Cardiologist:  Lenna Sciara, MD ?Referring MD: Ramiro Harvest, PA-C  ? ?Chief Complaint/Reason for Referral:  DOT evaluation ? ?ASSESSMENT:   ? ?Encounter for occupational health assessment ? ?Hyperlipidemia, unspecified hyperlipidemia type ? ?Aortic atherosclerosis ([place]) ? ?BMI 30.0-30.9,adult ? ?Precordial pain ? ?PLAN:   ? ?In order of problems listed above: ? ?1.  We will obtain echocardiogram and ETT.  Follow up 1 year. ?2.  Continue rosuvastatin.  Patient's LDL goal is less than 70 given presence of aortic atherosclerosis.  Will check lipid panel and LFTs in future. ?3.  Start aspirin 81 mg and continue rosuvastatin. ?4.  This is being managed by the patient's primary care provider who has recommended diet and exercise. ?5.  ETT and echocardiogram as above. ? ?     ? ?Shared Decision Making/Informed Consent ?The risks [chest pain, shortness of breath, cardiac arrhythmias, dizziness, blood pressure fluctuations, myocardial infarction, stroke/transient ischemic attack, and life-threatening complications (estimated to be 1 in 10,000)], benefits (risk stratification, diagnosing coronary artery disease, treatment guidance) and alternatives of an exercise tolerance test were discussed in detail with Mark Conway and he agrees to proceed.  ? ?Dispo:  Return in about 1 year (around 07/03/2022).  ?  ? ?Medication Adjustments/Labs and Tests Ordered: ?Current medicines are reviewed at length with the patient today.  Concerns regarding medicines are outlined above.  ? ?Tests Ordered: ?No orders of the defined types were placed in this encounter. ? ? ?Medication Changes: ?No orders of the defined types were placed in this encounter. ? ? ?History of Present Illness:   ? ?FOCUSED PROBLEM LIST:   ?1.  Hyperlipidemia  ?2.  BMI of 30-39  ?3.  Family history of myocardial infarction   ?4.  Aortic atherosclerosis on CT scan 2019 ? ?The patient is a 66 y.o. 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  He has been doing very well.  May be once or twice a month he develop chest tightness.  He is unable to tell me exact details about this.  It can happen at rest and with exertion.  Rest does not make it better and it seems to go away on its own with deep breathing.  He denies any lightheadedness, shortness of breath, peripheral edema, paroxysmal nocturnal dyspnea, or orthopnea.  He has required no emergency room visits or hospitalizations.  He is otherwise well without complaints.  Of note he continues to abstain from tobacco.  He denies any claudication symptoms, severe bleeding, signs or symptoms of stroke, or palpitations. ?    ?  ?Previous Medical History: ?Past Medical History:  ?Diagnosis Date  ? Anxiety   ? Depression   ? ED (erectile dysfunction)   ? Hernia of abdominal wall   ? Hyperlipidemia   ? Obesity   ? Situational stress   ? ? ? ?Current Medications: ?Current Meds  ?Medication Sig  ? KRILL OIL PO Take 1,200 mg by mouth.  ? Omega-3 Fatty Acids (FISH OIL PO) Take by mouth.  ? rosuvastatin (CRESTOR) 5 MG tablet Take 5 mg by mouth daily.  ? sertraline (ZOLOFT) 50 MG tablet Take 1 tablet (50 mg total) by mouth daily.  ? vitamin k 100 MCG tablet Take 100  mcg by mouth daily.  ? Zinc 50 MG CAPS Take by mouth.  ? [DISCONTINUED] Cyanocobalamin (VITAMIN B 12 PO) Take 100 mg by mouth.  ? [DISCONTINUED] lidocaine (LIDODERM) 5 % Place 1 patch onto the skin daily. Remove & Discard patch within 12 hours or as directed by MD  ? [DISCONTINUED] Papav-Phentolamine-Alprostadil 150-10-100 MG-MG-MCG SOLR by Intracavernosal route.  ?  ? ?Allergies:    ?Patient has no known allergies.  ? ?Social History:   ?Social History  ? ?Tobacco Use  ? Smoking status: Former  ?  Types: Cigarettes  ?   Quit date: 08/04/2013  ?  Years since quitting: 7.9  ? Smokeless tobacco: Never  ?Vaping Use  ? Vaping Use: Never used  ?Substance Use Topics  ? Alcohol use: No  ?  Comment: rarely  ? Drug use: No  ?  ? ?Family Hx: ?Family History  ?Problem Relation Age of Onset  ? Dementia Mother   ? Hyperlipidemia Mother   ? Heart attack Father   ? Heart disease Father   ? Healthy Sister   ?  ? ?Review of Systems:   ?Please see the history of present illness.    ?All other systems reviewed and are negative. ?  ? ? ?EKGs/Labs/Other Test Reviewed:   ? ?EKG: EKG from 2019 demonstrates sinus rhythm with old inferolateral infarction; EKG today demonstrates sinus rhythm ? ?Prior CV studies: ?None available ? ?Imaging studies that I have independently reviewed today:  ? ?CT 2019 ?-Large periumbilical hernia is noted which contains a portion of ?incarcerated small bowel, which results in proximal small bowel ?obstruction. ?-Diverticulosis of descending and sigmoid colon is noted without ?inflammation. ?-Aortic Atherosclerosis (ICD10-I70.0). ? ?Recent Labs: ?No results found for requested labs within last 8760 hours.  ? ?Recent Lipid Panel ?Lab Results  ?Component Value Date/Time  ? CHOL 192 02/15/2016 12:00 AM  ? TRIG 147 02/15/2016 12:00 AM  ? HDL 30 (A) 02/15/2016 12:00 AM  ? LDLCALC 133 02/15/2016 12:00 AM  ? ? ?Risk Assessment/Calculations:   ? ?  ?    ? ?Physical Exam:   ? ?VS:  BP 124/76   Pulse 76   Ht 6' (1.829 m)   Wt 245 lb (111.1 kg)   BMI 33.23 kg/m?    ?Wt Readings from Last 3 Encounters:  ?07/02/21 245 lb (111.1 kg)  ?09/17/19 222 lb (100.7 kg)  ?03/25/19 254 lb (115.2 kg)  ?  ?GENERAL:  No apparent distress, AOx3 ?HEENT:  No carotid bruits, +2 carotid impulses, no scleral icterus ?CAR: RRR no murmurs, gallops, rubs, or thrills ?RES:  Clear to auscultation bilaterally ?ABD:  Soft, nontender, nondistended, positive bowel sounds x 4 ?VASC:  +2 radial pulses, +2 carotid pulses, palpable pedal pulses ?NEURO:  CN 2-12 grossly  intact; motor and sensory grossly intact ?PSYCH:  No active depression or anxiety ?EXT:  No edema, ecchymosis, or cyanosis ? ?Signed, ?Early Osmond, MD  ?07/02/2021 8:47 AM    ?[facility] ?[address], [place], [place]  [address] ?Phone: [phone]; Fax: [phone]  ? ?Note:  This document was prepared using Dragon voice recognition software and may include unintentional dictation errors. ?

## 2021-07-02 ENCOUNTER — Encounter: Payer: Self-pay | Admitting: Internal Medicine

## 2021-07-02 ENCOUNTER — Ambulatory Visit: Payer: BC Managed Care – PPO | Admitting: Internal Medicine

## 2021-07-02 ENCOUNTER — Other Ambulatory Visit: Payer: Self-pay

## 2021-07-02 VITALS — BP 124/76 | HR 76 | Ht 72.0 in | Wt 245.0 lb

## 2021-07-02 DIAGNOSIS — Z0289 Encounter for other administrative examinations: Secondary | ICD-10-CM | POA: Diagnosis not present

## 2021-07-02 DIAGNOSIS — Z6830 Body mass index (BMI) 30.0-30.9, adult: Secondary | ICD-10-CM

## 2021-07-02 DIAGNOSIS — I700 Atherosclerosis of aorta: Secondary | ICD-10-CM

## 2021-07-02 DIAGNOSIS — E785 Hyperlipidemia, unspecified: Secondary | ICD-10-CM

## 2021-07-02 DIAGNOSIS — R072 Precordial pain: Secondary | ICD-10-CM

## 2021-07-02 MED ORDER — ASPIRIN EC 81 MG PO TBEC: 81.0000 mg | DELAYED_RELEASE_TABLET | Freq: Every day | ORAL | 3 refills | Status: AC

## 2021-07-02 NOTE — Addendum Note (Signed)
Addended by: Alverda Skeans on: 07/02/2021 11:04 AM ? ? Modules accepted: Orders ? ?

## 2021-07-02 NOTE — Patient Instructions (Signed)
Medication Instructions:  ?Start Aspirin 81mg  daily ?*If you need a refill on your cardiac medications before your next appointment, please call your pharmacy* ? ? ?Lab Work: ?NONE ?If you have labs (blood work) drawn today and your tests are completely normal, you will receive your results only by: ?MyChart Message (if you have MyChart) OR ?A paper copy in the mail ?If you have any lab test that is abnormal or we need to change your treatment, we will call you to review the results. ? ? ?Testing/Procedures: ?ECHO ?Your physician has requested that you have an echocardiogram. Echocardiography is a painless test that uses sound waves to create images of your heart. It provides your doctor with information about the size and shape of your heart and how well your heart?s chambers and valves are working. This procedure takes approximately one hour. There are no restrictions for this procedure. ? ?STRESS TEST ?Your physician has requested that you have an exercise tolerance test. For further information please visit . Please also follow instruction sheet, as given. ? ? ? ?Follow-Up: ?At [facility], you and your health needs are our priority.  As part of our continuing mission to provide you with exceptional heart care, we have created designated Provider Care Teams.  These Care Teams include your primary Cardiologist (physician) and Advanced Practice Providers (APPs -  Physician Assistants and Nurse Practitioners) who all work together to provide you with the care you need, when you need it. ? ?We recommend signing up for the patient portal called "MyChart".  Sign up information is provided on this After Visit Summary.  MyChart is used to connect with patients for Virtual Visits (Telemedicine).  Patients are able to view lab/test results, encounter notes, upcoming appointments, etc.  Non-urgent messages can be sent to your provider as well.   ?To learn more about what you can do with MyChart, go to  [facility].   ? ?Your next appointment:   ?1 year(s) ? ?The format for your next appointment:   ?In Person ? ?Provider:   ?[email or web address] ? ?  ?

## 2021-07-02 NOTE — Addendum Note (Signed)
Addended by: Lars Mage on: 07/02/2021 10:04 AM ? ? Modules accepted: Orders ? ?

## 2021-07-19 ENCOUNTER — Ambulatory Visit (HOSPITAL_COMMUNITY): Payer: BC Managed Care – PPO | Attending: Cardiology

## 2021-07-19 ENCOUNTER — Ambulatory Visit (INDEPENDENT_AMBULATORY_CARE_PROVIDER_SITE_OTHER): Payer: BC Managed Care – PPO

## 2021-07-19 DIAGNOSIS — Z6830 Body mass index (BMI) 30.0-30.9, adult: Secondary | ICD-10-CM

## 2021-07-19 DIAGNOSIS — I700 Atherosclerosis of aorta: Secondary | ICD-10-CM

## 2021-07-19 DIAGNOSIS — R072 Precordial pain: Secondary | ICD-10-CM

## 2021-07-19 DIAGNOSIS — Z0289 Encounter for other administrative examinations: Secondary | ICD-10-CM | POA: Insufficient documentation

## 2021-07-19 LAB — EXERCISE TOLERANCE TEST
Angina Index: 0
Duke Treadmill Score: 8
Estimated workload: 10.1
Exercise duration (min): 8 min
Exercise duration (sec): 25 s
MPHR: 156 {beats}/min
Peak HR: 134 {beats}/min
Percent HR: 85 %
RPE: 17
Rest HR: 54 {beats}/min
ST Depression (mm): 0 mm

## 2021-07-19 LAB — ECHOCARDIOGRAM COMPLETE
Area-P 1/2: 3.78 cm2
S' Lateral: 3.1 cm

## 2022-07-04 NOTE — Progress Notes (Signed)
Cardiology Office Note:    Date:  07/08/2022   ID:  Mark Conway, DOB 1956-04-20, MRN [id number]  PCP:  [facility] Cardiologist:  Lenna Sciara, MD Referring MD: [facility]*   Chief Complaint/Reason for Referral:  DOT evaluation  ASSESSMENT:    Aortic atherosclerosis ([place]) - Plan: EKG 12-Lead, VAS US VASCUSCREEN  Hyperlipidemia LDL goal <70 - Plan: VAS US VASCUSCREEN, Lipid Profile, Hepatic function panel, Lipoprotein A (LPA)  BMI 30.0-30.9,adult - Plan: VAS US VASCUSCREEN  Tobacco abuse - Plan: VAS US VASCUSCREEN  PLAN:    In order of problems listed above:  1.  Aortic atherosclerosis: Continue aspirin and statin. 2.  Hyperlipidemia: Check lipid panel, LFTs, and LP(a) in 2 months. 3.  Elevated BMI: Continue diet and exercise. 4.  Tobacco abuse: Patient has a 60-pack-year smoking history and quit several years ago.  Will get a vascular scan screening with carotids, abdominal ultrasound, and ABIs.          Dispo:  Return in about 1 year (around 07/08/2023).     Medication Adjustments/Labs and Tests Ordered: Current medicines are reviewed at length with the patient today.  Concerns regarding medicines are outlined above.   Tests Ordered: Orders Placed This Encounter  Procedures   Lipid Profile   Hepatic function panel   Lipoprotein A (LPA)   EKG 12-Lead   VAS US VASCUSCREEN    Medication Changes: No orders of the defined types were placed in this encounter.   History of Present Illness:    FOCUSED PROBLEM LIST:   1.  Hyperlipidemia  2.  BMI of 30-39  3.  Family history of myocardial infarction  4.  Aortic atherosclerosis on CT scan 2019  March 2023:  The patient is a 67 y.o. 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  He has been doing very well.  May be once or twice  a month he develop chest tightness.  He is unable to tell me exact details about this.  It can happen at rest and with exertion.  Rest does not make it better and it seems to go away on its own with deep breathing.  He denies any lightheadedness, shortness of breath, peripheral edema, paroxysmal nocturnal dyspnea, or orthopnea.  He has required no emergency room visits or hospitalizations.  He is otherwise well without complaints.  Of note he continues to abstain from tobacco.  He denies any claudication symptoms, severe bleeding, signs or symptoms of stroke, or palpitations.  Plan: Obtain echocardiogram, exercise treadmill stress test, lipid panel, LFTs, and start aspirin.  Today: In the interim the patient had an echocardiogram and treadmill stress test that were reassuring.  He has been doing well.  Unfortunately he has been off his aspirin and cholesterol pill for some time.  He restarted his Crestor may be a week ago.  He denies any cardiovascular symptoms.  His biggest issue is some mild lower extremity bilateral numbness particularly in his first toes.  His PCP did not think this was related to a diabetic neuropathy.  He is wearing thicker socks and this seems to help.         Current Medications: Current Meds  Medication Sig   aspirin EC 81 MG tablet Take 1 tablet (81 mg total) by mouth daily. Swallow whole.   KRILL OIL PO  Take 1,200 mg by mouth.   Omega-3 Fatty Acids (FISH OIL PO) Take by mouth.   rosuvastatin (CRESTOR) 5 MG tablet Take 5 mg by mouth daily.   sertraline (ZOLOFT) 50 MG tablet Take 1 tablet (50 mg total) by mouth daily.   vitamin k 100 MCG tablet Take 100 mcg by mouth daily.   Zinc 50 MG CAPS Take by mouth.     Allergies:    Patient has no known allergies.   Social History:   Social History   Tobacco Use   Smoking status: Former    Types: Cigarettes    Quit date: 08/04/2013    Years since quitting: 8.9   Smokeless tobacco: Never  Vaping Use   Vaping Use: Never  used  Substance Use Topics   Alcohol use: No    Comment: rarely   Drug use: No     Family Hx: Family History  Problem Relation Age of Onset   Dementia Mother    Hyperlipidemia Mother    Heart attack Father    Heart disease Father    Healthy Sister      Review of Systems:   Please see the history of present illness.    All other systems reviewed and are negative.     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  Stress Findings A Bruce protocol stress test was performed. Exercise capacity was normal. Patient exercised for 8 min and 25 sec. Maximum HR of 134 bpm. MPHR 85.0 %. Peak METS 10.1 .  The patient experienced no angina during the test. The patient achieved the target heart rate. The patient requested the test to be stopped. The patient reported no symptoms during the stress test. Normal blood pressure and normal heart rate response noted during stress. Heart rate recovery was normal.  Stress ECG No ST deviation was noted. ECG was interpretable and conclusive. The ECG was negative for ischemia.   TTE 2023: 1. Left ventricular ejection fraction, by estimation, is 60 to 65%. The  left ventricle has normal function. The left ventricle has no regional  wall motion abnormalities. Left ventricular diastolic parameters were  normal. The average left ventricular  global longitudinal strain is -25.3 %. The global longitudinal strain is  normal.   2. Right ventricular systolic function is normal. The right ventricular  size is normal. There is normal pulmonary artery systolic pressure.   3. The mitral valve is normal in structure. Trivial mitral valve  regurgitation. No evidence of mitral stenosis.   4. The aortic valve is tricuspid. Aortic valve regurgitation is not   visualized. No aortic stenosis is present.   5. The inferior vena cava is normal in size with greater than 50%  respiratory variability, suggesting right atrial pressure of 3 mmHg.   Imaging studies that I have independently reviewed today:   CT [id number] -Large periumbilical hernia is noted which contains a portion of incarcerated small bowel, which results in proximal small bowel obstruction. -Diverticulosis of descending and sigmoid colon is noted without inflammation. -Aortic Atherosclerosis (ICD10-I70.0).  Recent Labs: No results found for requested labs within last 365 days.   Recent Lipid Panel Lab Results  Component Value Date/Time   CHOL 192 02/15/2016 12:00 AM   TRIG 147 02/15/2016 12:00 AM   HDL 30 (A) 02/15/2016 12:00  AM   LDLCALC 133 02/15/2016 12:00 AM    Risk Assessment/Calculations:           Physical Exam:    VS:  BP 100/60   Pulse (!) 58   Ht 6' (1.829 m)   Wt 227 lb 12.8 oz (103.3 kg)   SpO2 98%   BMI 30.90 kg/m    Wt Readings from Last 3 Encounters:  07/08/22 227 lb 12.8 oz (103.3 kg)  07/02/21 245 lb (111.1 kg)  09/17/19 222 lb (100.7 kg)    GENERAL:  No apparent distress, AOx3 HEENT:  No carotid bruits, +2 carotid impulses, no scleral icterus CAR: RRR no murmurs, gallops, rubs, or thrills RES:  Clear to auscultation bilaterally ABD:  Soft, nontender, nondistended, positive bowel sounds x 4 VASC:  +2 radial pulses, +2 carotid pulses, palpable pedal pulses NEURO:  CN 2-12 grossly intact; motor and sensory grossly intact PSYCH:  No active depression or anxiety EXT:  No edema, ecchymosis, or cyanosis  Signed, Early Osmond, MD  07/08/2022 3:42 PM    [address], [place], [place]  [address] Phone: [phone]; Fax: [phone]   Note:  This document was prepared using Dragon voice recognition software and may include unintentional dictation errors.

## 2022-07-08 ENCOUNTER — Encounter: Payer: Self-pay | Admitting: Internal Medicine

## 2022-07-08 ENCOUNTER — Ambulatory Visit: Payer: BC Managed Care – PPO | Attending: Internal Medicine | Admitting: Internal Medicine

## 2022-07-08 VITALS — BP 100/60 | HR 58 | Ht 72.0 in | Wt 227.8 lb

## 2022-07-08 DIAGNOSIS — Z720 Tobacco use: Secondary | ICD-10-CM

## 2022-07-08 DIAGNOSIS — E785 Hyperlipidemia, unspecified: Secondary | ICD-10-CM

## 2022-07-08 DIAGNOSIS — I700 Atherosclerosis of aorta: Secondary | ICD-10-CM

## 2022-07-08 DIAGNOSIS — Z6830 Body mass index (BMI) 30.0-30.9, adult: Secondary | ICD-10-CM

## 2022-07-08 NOTE — Patient Instructions (Signed)
Medication Instructions:  Your physician recommends that you continue on your current medications as directed. Please refer to the Current Medication list given to you today.  *If you need a refill on your cardiac medications before your next appointment, please call your pharmacy*   Lab Work: Your physician recommends that you return for lab work in: 2 months.  Lipid, liver and Lipoprotein (a)  If you have labs (blood work) drawn today and your tests are completely normal, you will receive your results only by: MyChart Message (if you have MyChart) OR A paper copy in the mail If you have any lab test that is abnormal or we need to change your treatment, we will call you to review the results.   Testing/Procedures: Dr Ali Lowe recommends you have a Vascuscreen.     Follow-Up: At [facility], you and your health needs are our priority.  As part of our continuing mission to provide you with exceptional heart care, we have created designated Provider Care Teams.  These Care Teams include your primary Cardiologist (physician) and Advanced Practice Providers (APPs -  Physician Assistants and Nurse Practitioners) who all work together to provide you with the care you need, when you need it.  We recommend signing up for the patient portal called "MyChart".  Sign up information is provided on this After Visit Summary.  MyChart is used to connect with patients for Virtual Visits (Telemedicine).  Patients are able to view lab/test results, encounter notes, upcoming appointments, etc.  Non-urgent messages can be sent to your provider as well.   To learn more about what you can do with MyChart, go to [email or web address].    Your next appointment:   12 month(s)  Provider:   Nicholes Rough, PA-C, Melina Copa, PA-C, Ambrose Pancoast, NP, Ermalinda Barrios, PA-C, Christen Bame, NP, or Richardson Dopp, PA-C         Other Instructions

## 2022-07-17 ENCOUNTER — Ambulatory Visit (HOSPITAL_COMMUNITY)
Admission: RE | Admit: 2022-07-17 | Payer: BC Managed Care – PPO | Source: Ambulatory Visit | Attending: Internal Medicine | Admitting: Internal Medicine

## 2022-07-22 ENCOUNTER — Ambulatory Visit (HOSPITAL_COMMUNITY)
Admission: RE | Admit: 2022-07-22 | Discharge: 2022-07-22 | Disposition: A | Discharge status: Home / Self Care | Payer: BC Managed Care – PPO | Source: Ambulatory Visit | Attending: Internal Medicine | Admitting: Internal Medicine

## 2022-07-22 DIAGNOSIS — E785 Hyperlipidemia, unspecified: Secondary | ICD-10-CM

## 2022-07-22 DIAGNOSIS — I700 Atherosclerosis of aorta: Secondary | ICD-10-CM

## 2022-07-22 DIAGNOSIS — Z720 Tobacco use: Secondary | ICD-10-CM

## 2022-07-22 DIAGNOSIS — Z6830 Body mass index (BMI) 30.0-30.9, adult: Secondary | ICD-10-CM

## 2022-07-30 ENCOUNTER — Encounter (HOSPITAL_COMMUNITY): Payer: Self-pay | Admitting: Cardiology

## 2022-07-31 ENCOUNTER — Other Ambulatory Visit: Payer: Self-pay | Admitting: *Deleted

## 2022-07-31 DIAGNOSIS — Z720 Tobacco use: Secondary | ICD-10-CM

## 2022-07-31 DIAGNOSIS — I714 Abdominal aortic aneurysm, without rupture, unspecified: Secondary | ICD-10-CM

## 2022-07-31 NOTE — Progress Notes (Signed)
AAA duplex per Vascu screening results.

## 2022-08-16 DIAGNOSIS — Z6830 Body mass index (BMI) 30.0-30.9, adult: Secondary | ICD-10-CM | POA: Diagnosis not present

## 2022-08-16 DIAGNOSIS — E785 Hyperlipidemia, unspecified: Secondary | ICD-10-CM | POA: Diagnosis not present

## 2022-08-16 DIAGNOSIS — Z008 Encounter for other general examination: Secondary | ICD-10-CM | POA: Diagnosis not present

## 2022-08-16 DIAGNOSIS — E669 Obesity, unspecified: Secondary | ICD-10-CM | POA: Diagnosis not present

## 2022-08-16 DIAGNOSIS — I7090 Unspecified atherosclerosis: Secondary | ICD-10-CM | POA: Diagnosis not present

## 2022-08-16 DIAGNOSIS — Z8249 Family history of ischemic heart disease and other diseases of the circulatory system: Secondary | ICD-10-CM | POA: Diagnosis not present

## 2022-08-16 DIAGNOSIS — N529 Male erectile dysfunction, unspecified: Secondary | ICD-10-CM | POA: Diagnosis not present

## 2022-08-16 DIAGNOSIS — I739 Peripheral vascular disease, unspecified: Secondary | ICD-10-CM | POA: Diagnosis not present

## 2022-08-16 DIAGNOSIS — F419 Anxiety disorder, unspecified: Secondary | ICD-10-CM | POA: Diagnosis not present

## 2022-08-16 DIAGNOSIS — Z720 Tobacco use: Secondary | ICD-10-CM | POA: Diagnosis not present

## 2022-09-02 ENCOUNTER — Ambulatory Visit: Payer: BC Managed Care – PPO | Attending: Internal Medicine

## 2022-09-02 DIAGNOSIS — E785 Hyperlipidemia, unspecified: Secondary | ICD-10-CM

## 2022-09-03 LAB — HEPATIC FUNCTION PANEL
ALT: 20 IU/L (ref 0–44)
AST: 22 IU/L (ref 0–40)
Albumin: 4.4 g/dL (ref 3.9–4.9)
Alkaline Phosphatase: 59 IU/L (ref 44–121)
Bilirubin Total: 0.3 mg/dL (ref 0.0–1.2)
Bilirubin, Direct: 0.14 mg/dL (ref 0.00–0.40)
Total Protein: 6.4 g/dL (ref 6.0–8.5)

## 2022-09-03 LAB — LIPID PANEL
Chol/HDL Ratio: 3.2 ratio (ref 0.0–5.0)
Cholesterol, Total: 127 mg/dL (ref 100–199)
HDL: 40 mg/dL (ref >39)
LDL Chol Calc (NIH): 74 mg/dL (ref 0–99)
Triglycerides: 63 mg/dL (ref 0–149)
VLDL Cholesterol Cal: 13 mg/dL (ref 5–40)

## 2022-09-03 LAB — LIPOPROTEIN A (LPA): Lipoprotein (a): 38.5 nmol/L (ref <75.0)

## 2022-09-04 ENCOUNTER — Other Ambulatory Visit: Payer: Self-pay

## 2022-09-04 DIAGNOSIS — E785 Hyperlipidemia, unspecified: Secondary | ICD-10-CM

## 2022-09-04 MED ORDER — ROSUVASTATIN CALCIUM 10 MG PO TABS
10.0000 mg | ORAL_TABLET | Freq: Every day | ORAL | 3 refills | Status: DC
Start: 2022-09-04 — End: 2022-11-05

## 2022-10-30 ENCOUNTER — Ambulatory Visit: Payer: BC Managed Care – PPO | Attending: Internal Medicine

## 2022-10-30 DIAGNOSIS — E785 Hyperlipidemia, unspecified: Secondary | ICD-10-CM

## 2022-10-31 LAB — HEPATIC FUNCTION PANEL
ALT: 13 IU/L (ref 0–44)
AST: 15 IU/L (ref 0–40)
Albumin: 4.3 g/dL (ref 3.9–4.9)
Alkaline Phosphatase: 65 IU/L (ref 44–121)
Bilirubin Total: 0.3 mg/dL (ref 0.0–1.2)
Bilirubin, Direct: 0.11 mg/dL (ref 0.00–0.40)
Total Protein: 6.4 g/dL (ref 6.0–8.5)

## 2022-10-31 LAB — LIPID PANEL
Chol/HDL Ratio: 3.4 ratio (ref 0.0–5.0)
Cholesterol, Total: 139 mg/dL (ref 100–199)
HDL: 41 mg/dL (ref >39)
LDL Chol Calc (NIH): 76 mg/dL (ref 0–99)
Triglycerides: 120 mg/dL (ref 0–149)
VLDL Cholesterol Cal: 22 mg/dL (ref 5–40)

## 2022-11-05 ENCOUNTER — Telehealth: Payer: Self-pay | Admitting: *Deleted

## 2022-11-05 DIAGNOSIS — E785 Hyperlipidemia, unspecified: Secondary | ICD-10-CM

## 2022-11-05 MED ORDER — ROSUVASTATIN CALCIUM 10 MG PO TABS: 10.0000 mg | ORAL_TABLET | Freq: Every day | ORAL | 3 refills | Status: AC

## 2022-11-05 NOTE — Telephone Encounter (Signed)
-----   Message from Mark Conway sent at 10/31/2022  6:24 AM EDT ----- Make sure he increased crestor to 20.  If he did, increase it to 40 and check lipids/lfts in 2 months.  If he didn't, make sure he takes 20 and check lipids/lfts in 2 months

## 2023-01-01 ENCOUNTER — Ambulatory Visit: Payer: BC Managed Care – PPO | Attending: Internal Medicine

## 2023-01-01 DIAGNOSIS — M25572 Pain in left ankle and joints of left foot: Secondary | ICD-10-CM | POA: Diagnosis not present

## 2023-01-01 DIAGNOSIS — Z23 Encounter for immunization: Secondary | ICD-10-CM | POA: Diagnosis not present

## 2023-01-01 DIAGNOSIS — E782 Mixed hyperlipidemia: Secondary | ICD-10-CM | POA: Diagnosis not present

## 2023-01-01 DIAGNOSIS — E785 Hyperlipidemia, unspecified: Secondary | ICD-10-CM

## 2023-01-01 LAB — LIPID PANEL
Chol/HDL Ratio: 2.8 ratio (ref 0.0–5.0)
Cholesterol, Total: 110 mg/dL (ref 100–199)
HDL: 39 mg/dL — ABNORMAL LOW (ref >39)
LDL Chol Calc (NIH): 58 mg/dL (ref 0–99)
Triglycerides: 56 mg/dL (ref 0–149)
VLDL Cholesterol Cal: 13 mg/dL (ref 5–40)

## 2023-01-01 LAB — HEPATIC FUNCTION PANEL
ALT: 15 IU/L (ref 0–44)
AST: 18 IU/L (ref 0–40)
Albumin: 4.4 g/dL (ref 3.9–4.9)
Alkaline Phosphatase: 59 IU/L (ref 44–121)
Bilirubin Total: 0.4 mg/dL (ref 0.0–1.2)
Bilirubin, Direct: 0.14 mg/dL (ref 0.00–0.40)
Total Protein: 6.4 g/dL (ref 6.0–8.5)

## 2023-01-21 DIAGNOSIS — U071 COVID-19: Secondary | ICD-10-CM | POA: Diagnosis not present

## 2023-08-08 ENCOUNTER — Other Ambulatory Visit (HOSPITAL_COMMUNITY)

## 2023-08-12 ENCOUNTER — Encounter: Payer: Self-pay | Admitting: *Deleted
